# Patient Record
Sex: Female | Born: 1980
Health system: Southern US, Community
[De-identification: ages and names within clinical notes are randomized; demographics above are authoritative.]

## PROBLEM LIST (undated history)

## (undated) ENCOUNTER — Inpatient Hospital Stay (HOSPITAL_COMMUNITY): Payer: Self-pay

## (undated) DIAGNOSIS — F419 Anxiety disorder, unspecified: Secondary | ICD-10-CM

## (undated) DIAGNOSIS — O09529 Supervision of elderly multigravida, unspecified trimester: Secondary | ICD-10-CM

## (undated) DIAGNOSIS — L509 Urticaria, unspecified: Secondary | ICD-10-CM

## (undated) HISTORY — PX: TYMPANOSTOMY TUBE PLACEMENT: SHX32

## (undated) HISTORY — PX: DILATION AND CURETTAGE OF UTERUS: SHX78

## (undated) HISTORY — DX: Anxiety disorder, unspecified: F41.9

## (undated) HISTORY — DX: Supervision of elderly multigravida, unspecified trimester: O09.529

## (undated) HISTORY — DX: Urticaria, unspecified: L50.9

---
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
## 2001-10-31 ENCOUNTER — Other Ambulatory Visit: Admission: RE | Admit: 2001-10-31 | Discharge: 2001-10-31 | Payer: Self-pay

## 2001-11-03 ENCOUNTER — Encounter: Admission: RE | Admit: 2001-11-03 | Discharge: 2001-11-03 | Payer: Self-pay

## 2002-11-27 ENCOUNTER — Other Ambulatory Visit: Admission: RE | Admit: 2002-11-27 | Discharge: 2002-11-27 | Payer: Self-pay

## 2004-02-19 ENCOUNTER — Other Ambulatory Visit: Admission: RE | Admit: 2004-02-19 | Discharge: 2004-02-19 | Payer: Self-pay | Admitting: Obstetrics and Gynecology

## 2005-05-13 ENCOUNTER — Other Ambulatory Visit: Admission: RE | Admit: 2005-05-13 | Discharge: 2005-05-13 | Payer: Self-pay | Admitting: Obstetrics and Gynecology

## 2006-09-12 ENCOUNTER — Encounter: Admission: RE | Admit: 2006-09-12 | Discharge: 2006-12-11 | Payer: Self-pay | Admitting: Gynecology

## 2006-11-30 ENCOUNTER — Other Ambulatory Visit: Admission: RE | Admit: 2006-11-30 | Discharge: 2006-11-30 | Payer: Self-pay | Admitting: Gynecology

## 2007-06-15 HISTORY — PX: GUM SURGERY: SHX658

## 2009-04-18 ENCOUNTER — Ambulatory Visit: Payer: Self-pay | Admitting: Family Medicine

## 2009-04-18 DIAGNOSIS — H01119 Allergic dermatitis of unspecified eye, unspecified eyelid: Secondary | ICD-10-CM | POA: Insufficient documentation

## 2010-04-01 ENCOUNTER — Ambulatory Visit: Payer: Self-pay | Admitting: Family Medicine

## 2010-04-01 DIAGNOSIS — L259 Unspecified contact dermatitis, unspecified cause: Secondary | ICD-10-CM | POA: Insufficient documentation

## 2010-04-02 ENCOUNTER — Encounter: Payer: Self-pay | Admitting: Family Medicine

## 2010-04-05 ENCOUNTER — Telehealth (INDEPENDENT_AMBULATORY_CARE_PROVIDER_SITE_OTHER): Payer: Self-pay

## 2010-07-14 NOTE — Progress Notes (Signed)
  Phone Note Outgoing Call   Call placed by: Areta Haber CMA,  April 05, 2010 3:02 PM Call placed to: Patient Summary of Call: Courtesy call to pt - Pt states her arm is getting better, rash not completely gone but better. Advised pt lab results were negative and to make sure she completes all medications as instructed. Pt stated she would. Initial call taken by: Areta Haber CMA,  April 05, 2010 3:27 PM

## 2010-07-14 NOTE — Assessment & Plan Note (Signed)
Summary: RASH UPPER RIGHT ARM/TJ x 4 dys rm 5   Vital Signs:  Patient Profile:   30 Years Old Female CC:      Rash - R arm x 4 dys Height:     65 inches Weight:      134 pounds O2 Sat:      100 % O2 treatment:    Room Air Temp:     98.4 degrees F oral Pulse rate:   74 / minute Pulse rhythm:   irregular Resp:     16 per minute BP sitting:   108 / 75  (left arm) Cuff size:   regular  Vitals Entered By: Areta Haber CMA (April 01, 2010 5:58 PM)                  Current Allergies: No known allergies History of Present Illness Chief Complaint: Rash - R arm x 4 dys History of Present Illness:  Subjective:  Patient was camping 4 days ago and awoke with a pruritic rash over her right upper arm, consisting of several small lesions that looked like mosquito bites.  The rash has gradually become more confluent, and has had some clear drainage at various times.  The next day after rash appeared she had some nausea/vomiting that rapidly resolved.  She has felt well otherwise; no fevers, chills, and sweats.  She visited [facility] this morning and was started on Cephalexin and triamcinolone cream 0.1%.  Current Problems: DERMATITIS (ICD-692.9) CONTACT DERMATITIS, EYELID (ICD-373.32)   Current Meds YAZ 3-0.02 MG TABS (DROSPIRENONE-ETHINYL ESTRADIOL)  TRIAMCINOLONE ACETONIDE 0.1 % CREA (TRIAMCINOLONE ACETONIDE) as directed CEPHALEXIN 500 MG TABS (CEPHALEXIN) 1 tab by mouth three times a day for 10 dys VALTREX 1 GM TABS (VALACYCLOVIR HCL) One by mouth q8hr for shingles  REVIEW OF SYSTEMS Constitutional Symptoms      Denies fever, chills, night sweats, weight loss, weight gain, and fatigue.  Eyes       Denies change in vision, eye pain, eye discharge, glasses, contact lenses, and eye surgery. Ear/Nose/Throat/Mouth       Denies hearing loss/aids, change in hearing, ear pain, ear discharge, dizziness, frequent runny nose, frequent nose bleeds, sinus problems, sore throat,  hoarseness, and tooth pain or bleeding.  Respiratory       Denies dry cough, productive cough, wheezing, shortness of breath, asthma, bronchitis, and emphysema/COPD.  Cardiovascular       Denies murmurs, chest pain, and tires easily with exhertion.    Gastrointestinal       Denies stomach pain, nausea/vomiting, diarrhea, constipation, blood in bowel movements, and indigestion. Genitourniary       Denies painful urination, kidney stones, and loss of urinary control. Neurological       Denies paralysis, seizures, and fainting/blackouts. Musculoskeletal       Complains of redness and swelling.      Denies muscle pain, joint pain, joint stiffness, decreased range of motion, muscle weakness, and gout.  Skin       Denies bruising, unusual mles/lumps or sores, and hair/skin or nail changes.      Comments: itchy x 4 dys Psych       Denies mood changes, temper/anger issues, anxiety/stress, speech problems, depression, and sleep problems. Other Comments: Pt states she went camping this wknd, thought she had insect/spider bite on R upper arm. Pt states she was seen this am @ [facility], Dx [facility], prescribed antiobiotic and Steriod cream. Pt states she is here for 2nd opinion. Pt has  not seen her PCP for this.   Past History:  Past Medical History: Last updated: 04/18/2009 Unremarkable  Past Surgical History: Last updated: 04/18/2009 Denies surgical history  Family History: Last updated: 04/18/2009 Mother, Breast CA Father, pre-diabetic  Social History: Last updated: 04/18/2009 Non-smoker ETOH-yes NO Drugs [employer]   Objective:  Appearance:  Patient appears healthy, stated age, and in no acute distress  Skin:  Right upper arm over deltoid area:  There is an erythematous, slightly raised,  Zoster-like eruption beginning at approximately the [facility] joint extending distally in a dermatomal distribution.  The largest area, about 2cm by 4cm has some areas of minimal clear  drainage.  No swelling or fluctuance. Assessment New Problems: DERMATITIS (ICD-692.9)  SUSPECT HERPES ZOSTER  Plan New Medications/Changes: VALTREX 1 GM TABS (VALACYCLOVIR HCL) One by mouth q8hr for shingles  #21 x 0, 04/01/2010, Donna Christen MD  New Orders: T-Culture, Wound [[id number]] Est. Patient Level III [21308] Planning Comments:   May continue the cephalexin and triamcinolone cream.  Add Valtrex.  Wound culture sent. Follow-up with dermatologist if not improving one week.   The patient and/or caregiver has been counseled thoroughly with regard to medications prescribed including dosage, schedule, interactions, rationale for use, and possible side effects and they verbalize understanding.  Diagnoses and expected course of recovery discussed and will return if not improved as expected or if the condition worsens. Patient and/or caregiver verbalized understanding.  Prescriptions: VALTREX 1 GM TABS (VALACYCLOVIR HCL) One by mouth q8hr for shingles  #21 x 0   Entered and Authorized by:   Donna Christen MD   Signed by:   Donna Christen MD on 04/01/2010   Method used:   Electronically to        [facility] [phone]* (retail)       [address] [place], [place]  [address]       Ph: [phone]       Fax: [phone]   RxID:   [id number]   Orders Added: 1)  T-Culture, Wound [[id number]] 2)  Est. Patient Level III [64332]

## 2011-06-06 ENCOUNTER — Emergency Department
Admit: 2011-06-06 | Discharge: 2011-06-06 | Disposition: A | Discharge status: Home / Self Care | Payer: Managed Care, Other (non HMO)

## 2011-06-06 ENCOUNTER — Emergency Department
Admission: EM | Admit: 2011-06-06 | Discharge: 2011-06-06 | Disposition: A | Discharge status: Home / Self Care | Payer: Managed Care, Other (non HMO) | Source: Home / Self Care | Attending: Emergency Medicine | Admitting: Emergency Medicine

## 2011-06-06 ENCOUNTER — Encounter: Payer: Self-pay | Admitting: Emergency Medicine

## 2011-06-06 DIAGNOSIS — S2341XA Sprain of ribs, initial encounter: Secondary | ICD-10-CM

## 2011-06-06 DIAGNOSIS — IMO0002 Reserved for concepts with insufficient information to code with codable children: Secondary | ICD-10-CM

## 2011-06-06 MED ORDER — CYCLOBENZAPRINE HCL 10 MG PO TABS: ORAL_TABLET | ORAL | Status: AC

## 2011-06-06 MED ORDER — ETODOLAC 500 MG PO TABS: 500.0000 mg | ORAL_TABLET | Freq: Two times a day (BID) | ORAL | Status: AC

## 2011-06-06 NOTE — ED Provider Notes (Signed)
History     CSN: [id number]  Arrival date & time 06/06/11  1519   First MD Initiated Contact with Patient 06/06/11 1550      Chief Complaint  Patient presents with  . Chest Pain    (Consider location/radiation/quality/duration/timing/severity/associated sxs/prior treatment) HPI Comments: 2 weeks ago, had URI symptoms with severe cough slight sputum, fever chills. She treated this with OTC symptomatic care in the URI symptoms, including the cough fever and chills have resolved.  However, chief complaint is pleuritic right lateral chest pain inferior and lateral to right breast. Denies breast symptoms. Never had these symptoms before.  She smokes cigarettes occasionally, and I counseled her on quitting smoking.  Patient is a 30 y.o. female presenting with chest pain. The history is provided by the patient.  Chest Pain The chest pain began 5 - 7 days ago. Chest pain occurs frequently. The chest pain is worsening. The pain is associated with coughing and breathing (Changing position). At its most intense, the pain is at 6/10. The pain is currently at 5/10. The severity of the pain is moderate. The quality of the pain is described as sharp. The pain does not radiate. Chest pain is worsened by certain positions and deep breathing. Primary symptoms include fatigue and cough (Rare, minimal cough. (Had much worse severe cough for over a week ago which is almost essentially resolved)). Pertinent negatives for primary symptoms include no fever, no syncope, no wheezing, no palpitations, no nausea, no vomiting and no dizziness.     History reviewed. No pertinent past medical history.  History reviewed. No pertinent past surgical history.  No family history on file.  History  Substance Use Topics  . Smoking status: Current Some Day Smoker  . Smokeless tobacco: Not on file  . Alcohol Use: Yes    OB History    Grav Para Term Preterm Abortions TAB SAB Ect Mult Living                   Review of Systems  Constitutional: Positive for fatigue. Negative for fever.  HENT: Negative.   Eyes: Negative.   Respiratory: Positive for cough (Rare, minimal cough. (Had much worse severe cough for over a week ago which is almost essentially resolved)). Negative for wheezing.   Cardiovascular: Positive for chest pain. Negative for palpitations, leg swelling and syncope.  Gastrointestinal: Negative for nausea, vomiting and abdominal distention.  Genitourinary: Negative.   Neurological: Negative.  Negative for dizziness.  Hematological: Negative.   Psychiatric/Behavioral: Negative.    She denies chance of pregnancy.  Allergies  Review of patient's allergies indicates no known allergies.  Home Medications   Current Outpatient Rx  Name Route Sig Dispense Refill  . CYCLOBENZAPRINE HCL 10 MG PO TABS  One half or one tablet every 8 hours as needed for muscle relaxant 21 tablet 0  . ETODOLAC 500 MG PO TABS Oral Take 1 tablet (500 mg total) by mouth 2 (two) times daily with a meal. As needed for pain. 20 tablet 0    BP 126/87  Pulse 90  Temp(Src) 99.6 F (37.6 C) (Oral)  Resp 16  Ht 5\' 5"  (1.651 m)  Wt 120 lb (54.432 kg)  BMI 19.97 kg/m2  SpO2 99%  LMP 05/22/2011  Physical Exam  Nursing note and vitals reviewed. Constitutional: She is oriented to person, place, and time. She appears well-developed and well-nourished.  Non-toxic appearance. No distress.       Uncomfortable from right lateral chest discomfort. But no  acute cardiorespiratory distress.  HENT:  Head: Normocephalic and atraumatic.  Right Ear: External ear normal.  Left Ear: External ear normal.  Mouth/Throat: Oropharynx is clear and moist.  Eyes: Conjunctivae and EOM are normal. Pupils are equal, round, and reactive to light. No scleral icterus.  Neck: Normal range of motion. Neck supple. No JVD present.  Cardiovascular: Normal rate, regular rhythm and normal heart sounds.  Exam reveals no gallop and no  friction rub.   No murmur heard. Pulmonary/Chest: Effort normal and breath sounds normal. No respiratory distress. She has no wheezes. She has no rales. She exhibits tenderness (Positive exquisite tenderness right anterior lateral chest wall.).       Breast exam deferred  Abdominal: Soft. She exhibits no distension. There is no tenderness.  Musculoskeletal: Normal range of motion. She exhibits no edema.  Lymphadenopathy:    She has no cervical adenopathy.  Neurological: She is alert and oriented to person, place, and time.  Skin: Skin is warm and dry. No rash noted.  Psychiatric: She has a normal mood and affect. Her behavior is normal.    ED Course  Procedures (including critical care time)  Labs Reviewed - No data to display Dg Ribs Unilateral W/chest Right  06/06/2011  *RADIOLOGY REPORT*  Clinical Data: Cough, right lower rib pain  RIGHT RIBS AND CHEST - 3+ VIEW  Comparison: None.  Findings: Normal heart size and vascularity.  Lungs remain clear. Negative for pneumonia, collapse, consolidation, effusion, or pneumothorax.  Trachea midline.  Right ribs appear intact.  No displaced fracture or focal rib abnormality  IMPRESSION: No acute finding or chest process  Original Report Authenticated By: Judie Petit. Ruel Favors, M.D.     1. Sprain and strain of ribs       MDM  We reviewed normal x-ray right ribs and chest x-ray. Likely, sprain and strain of ribs from excessive coughing 1 week ago. Her URI symptoms have  essentially resolved. Treat with Flexeril and Lodine. Rib belt applied and that definitely decrease some of her pain. Advised heat and other symptomatic care. Followup with PCP if no better one week, sooner if worse or new symptoms. I urged her to quit smoking and explained and risks of not doing so. She voiced understanding and agreement with treatment plan.        Lonell Face, MD 06/06/11 [phone]

## 2011-06-06 NOTE — ED Notes (Signed)
Had cough couple weeks ago and now has rib pain especially upon movement.

## 2011-06-10 ENCOUNTER — Telehealth: Payer: Self-pay | Admitting: Family Medicine

## 2013-01-29 LAB — OB RESULTS CONSOLE ABO/RH: RH Type: POSITIVE

## 2013-01-29 LAB — OB RESULTS CONSOLE HEPATITIS B SURFACE ANTIGEN: Hepatitis B Surface Ag: NEGATIVE

## 2013-01-29 LAB — OB RESULTS CONSOLE GC/CHLAMYDIA
CHLAMYDIA, DNA PROBE: NEGATIVE
GC PROBE AMP, GENITAL: NEGATIVE

## 2013-01-29 LAB — OB RESULTS CONSOLE HIV ANTIBODY (ROUTINE TESTING): HIV: NONREACTIVE

## 2013-01-29 LAB — OB RESULTS CONSOLE RPR: RPR: NONREACTIVE

## 2013-01-29 LAB — OB RESULTS CONSOLE ANTIBODY SCREEN: ANTIBODY SCREEN: NEGATIVE

## 2013-01-29 LAB — OB RESULTS CONSOLE RUBELLA ANTIBODY, IGM: RUBELLA: IMMUNE

## 2013-06-14 NOTE — L&D Delivery Note (Signed)
Delivery Note At 8:09 AM a viable female was delivered via Vaginal, Spontaneous Delivery (Presentation: ; Occiput Anterior).  APGAR:9/9 , ; weight .   Placenta status: Intact, Spontaneous.  Cord: 3 vessels with the following complications: None.  Cord pH: not sent Loose nuchal X 1 Anesthesia: Epidural  Episiotomy: None Lacerations: sec deg Suture Repair: 3.0 chromic Est. Blood Loss (mL): 300  Mom to postpartum.  Baby to Nursery.  Meriel PicaHOLLAND,Eamonn Sermeno M 08/22/2013, 8:23 AM

## 2013-08-21 ENCOUNTER — Inpatient Hospital Stay (HOSPITAL_COMMUNITY)
Admission: AD | Admit: 2013-08-21 | Discharge: 2013-08-24 | DRG: 775 | Disposition: A | Discharge status: Home / Self Care | Payer: Managed Care, Other (non HMO) | Source: Ambulatory Visit | Attending: Obstetrics and Gynecology | Admitting: Obstetrics and Gynecology

## 2013-08-21 ENCOUNTER — Encounter (HOSPITAL_COMMUNITY): Payer: Self-pay | Admitting: *Deleted

## 2013-08-21 DIAGNOSIS — O99334 Smoking (tobacco) complicating childbirth: Secondary | ICD-10-CM | POA: Diagnosis present

## 2013-08-21 DIAGNOSIS — K219 Gastro-esophageal reflux disease without esophagitis: Secondary | ICD-10-CM | POA: Diagnosis present

## 2013-08-21 DIAGNOSIS — Z3490 Encounter for supervision of normal pregnancy, unspecified, unspecified trimester: Secondary | ICD-10-CM

## 2013-08-21 LAB — OB RESULTS CONSOLE GBS: STREP GROUP B AG: NEGATIVE

## 2013-08-21 LAB — CBC
HCT: 35.9 % — ABNORMAL LOW (ref 36.0–46.0)
Hemoglobin: 12 g/dL (ref 12.0–15.0)
MCH: 29.7 pg (ref 26.0–34.0)
MCHC: 33.4 g/dL (ref 30.0–36.0)
MCV: 88.9 fL (ref 78.0–100.0)
Platelets: 286 K/uL (ref 150–400)
RBC: 4.04 MIL/uL (ref 3.87–5.11)
RDW: 13.6 % (ref 11.5–15.5)
WBC: 9.8 K/uL (ref 4.0–10.5)

## 2013-08-21 MED ORDER — LACTATED RINGERS IV SOLN
INTRAVENOUS | Status: DC
  Administered 2013-08-22: 04:00:00 via INTRAVENOUS

## 2013-08-21 MED ORDER — CITRIC ACID-SODIUM CITRATE 334-500 MG/5ML PO SOLN: 30.0000 mL | ORAL | Status: DC | PRN

## 2013-08-21 MED ORDER — OXYTOCIN 40 UNITS IN LACTATED RINGERS INFUSION - SIMPLE MED
62.5000 mL/h | INTRAVENOUS | Status: DC
  Administered 2013-08-22: 999 mL/h via INTRAVENOUS
  Filled 2013-08-21: qty 1000

## 2013-08-21 MED ORDER — IBUPROFEN 600 MG PO TABS: 600.0000 mg | ORAL_TABLET | Freq: Four times a day (QID) | ORAL | Status: DC | PRN

## 2013-08-21 MED ORDER — TERBUTALINE SULFATE 1 MG/ML IJ SOLN: 0.2500 mg | Freq: Once | INTRAMUSCULAR | Status: AC | PRN

## 2013-08-21 MED ORDER — MISOPROSTOL 25 MCG QUARTER TABLET
50.0000 ug | ORAL_TABLET | Freq: Once | ORAL | Status: AC
  Administered 2013-08-22: 50 ug via ORAL
  Filled 2013-08-21: qty 0.5

## 2013-08-21 MED ORDER — ONDANSETRON HCL 4 MG/2ML IJ SOLN
4.0000 mg | Freq: Four times a day (QID) | INTRAMUSCULAR | Status: DC | PRN
Start: 2013-08-21 — End: 2013-08-22

## 2013-08-21 MED ORDER — OXYTOCIN BOLUS FROM INFUSION: 500.0000 mL | INTRAVENOUS | Status: DC

## 2013-08-21 MED ORDER — LIDOCAINE HCL (PF) 1 % IJ SOLN
30.0000 mL | INTRAMUSCULAR | Status: DC | PRN
  Filled 2013-08-21: qty 30

## 2013-08-21 MED ORDER — OXYCODONE-ACETAMINOPHEN 5-325 MG PO TABS
1.0000 | ORAL_TABLET | ORAL | Status: DC | PRN
Start: 2013-08-21 — End: 2013-08-22

## 2013-08-21 MED ORDER — ACETAMINOPHEN 325 MG PO TABS
650.0000 mg | ORAL_TABLET | ORAL | Status: DC | PRN
Start: 2013-08-21 — End: 2013-08-22

## 2013-08-21 MED ORDER — OXYTOCIN 40 UNITS IN LACTATED RINGERS INFUSION - SIMPLE MED: 1.0000 m[IU]/min | INTRAVENOUS | Status: DC

## 2013-08-21 MED ORDER — LACTATED RINGERS IV SOLN: 500.0000 mL | INTRAVENOUS | Status: DC | PRN

## 2013-08-21 NOTE — H&P (Signed)
Taylor HawkingHolly E Bell is a 33 y.o. female presenting at 3237.5 after rom in office after exam.  Uncomplicated PNC.  Negative GBS Maternal Medical History:  Reason for admission: Rupture of membranes.   Contractions: Frequency: irregular.   Perceived severity is moderate.    Fetal activity: Perceived fetal activity is normal.    Prenatal complications: no prenatal complications Prenatal Complications - Diabetes: none.    OB History   Grav Para Term Preterm Abortions TAB SAB Ect Mult Living   2 0 0 0 1 0 1 0 0 0      History reviewed. No pertinent past medical history. Past Surgical History  Procedure Laterality Date  . Wisdom tooth extraction  2000  . Gum surgery  2009   Family History: family history is not on file. Social History:  reports that she has been smoking.  She does not have any smokeless tobacco history on file. She reports that she does not drink alcohol or use illicit drugs.   Prenatal Transfer Tool  Maternal Diabetes: No Genetic Screening: Normal Maternal Ultrasounds/Referrals: Normal Fetal Ultrasounds or other Referrals:  None Maternal Substance Abuse:  No Significant Maternal Medications:  None Significant Maternal Lab Results:  None Other Comments:  None  ROS  Dilation: 2 Effacement (%): 50 Station: -3 Exam by:: Valentina Lucks. Woods, RN Blood pressure 123/83, pulse 91, temperature 98.2 F (36.8 C), temperature source Oral, resp. rate 18, height 5\' 5"  (1.651 m), weight 74.39 kg (164 lb), unknown if currently breastfeeding. Maternal Exam:  Uterine Assessment: Contraction strength is mild.  Contraction frequency is irregular.   Abdomen: Patient reports no abdominal tenderness. Fundal height is c/w dates.   Estimated fetal weight is 7.   Fetal presentation: vertex  Introitus: Amniotic fluid character: clear.  Pelvis: adequate for delivery.   Cervix: 2 cm and 50 % effaced  vertex  Fetal Exam Fetal State Assessment: Category I - tracings are  normal.     Physical Exam  Prenatal labs: ABO, Rh: A/Positive/-- (08/18 0000) Antibody: Negative (08/18 0000) Rubella: Immune (08/18 0000) RPR: Nonreactive (08/18 0000)  HBsAg: Negative (08/18 0000)  HIV: Non-reactive (08/18 0000)  GBS: Negative (03/10 0000)   Assessment/Plan: IUP at 37.5 with ROM Begin pitocin risk discussed   Taylor Bell S 08/21/2013, 7:31 PM

## 2013-08-21 NOTE — MAU Note (Signed)
Per Dr. Arelia SneddonMcComb: Patient is to be a direct admit, confirmed ROM in the office by Dr. Vincente PoliGrewal.

## 2013-08-22 ENCOUNTER — Encounter (HOSPITAL_COMMUNITY): Payer: Managed Care, Other (non HMO) | Admitting: Anesthesiology

## 2013-08-22 ENCOUNTER — Inpatient Hospital Stay (HOSPITAL_COMMUNITY): Payer: Managed Care, Other (non HMO) | Admitting: Anesthesiology

## 2013-08-22 ENCOUNTER — Encounter (HOSPITAL_COMMUNITY): Payer: Self-pay | Admitting: Anesthesiology

## 2013-08-22 LAB — RPR: RPR: NONREACTIVE

## 2013-08-22 MED ORDER — BENZOCAINE-MENTHOL 20-0.5 % EX AERO
1.0000 "application " | INHALATION_SPRAY | CUTANEOUS | Status: DC | PRN
  Administered 2013-08-22: 1 via TOPICAL
  Filled 2013-08-22 (×2): qty 56

## 2013-08-22 MED ORDER — EPHEDRINE 5 MG/ML INJ
INTRAVENOUS | Status: AC
  Filled 2013-08-22: qty 4

## 2013-08-22 MED ORDER — FAMOTIDINE 20 MG PO TABS
20.0000 mg | ORAL_TABLET | Freq: Two times a day (BID) | ORAL | Status: DC
  Filled 2013-08-22 (×3): qty 1

## 2013-08-22 MED ORDER — PHENYLEPHRINE 40 MCG/ML (10ML) SYRINGE FOR IV PUSH (FOR BLOOD PRESSURE SUPPORT)
PREFILLED_SYRINGE | INTRAVENOUS | Status: AC
  Filled 2013-08-22: qty 10

## 2013-08-22 MED ORDER — DIBUCAINE 1 % RE OINT
1.0000 "application " | TOPICAL_OINTMENT | RECTAL | Status: DC | PRN
  Filled 2013-08-22: qty 28

## 2013-08-22 MED ORDER — PROMETHAZINE HCL 25 MG/ML IJ SOLN: 12.5000 mg | Freq: Once | INTRAMUSCULAR | Status: DC

## 2013-08-22 MED ORDER — FENTANYL 2.5 MCG/ML BUPIVACAINE 1/10 % EPIDURAL INFUSION (WH - ANES): 14.0000 mL/h | INTRAMUSCULAR | Status: DC | PRN

## 2013-08-22 MED ORDER — FLEET ENEMA 7-19 GM/118ML RE ENEM: 1.0000 | ENEMA | Freq: Every day | RECTAL | Status: DC | PRN

## 2013-08-22 MED ORDER — WITCH HAZEL-GLYCERIN EX PADS: 1.0000 "application " | MEDICATED_PAD | CUTANEOUS | Status: DC | PRN

## 2013-08-22 MED ORDER — LORATADINE 10 MG PO TABS
10.0000 mg | ORAL_TABLET | Freq: Every day | ORAL | Status: DC | PRN
  Filled 2013-08-22: qty 1

## 2013-08-22 MED ORDER — LACTATED RINGERS IV SOLN
500.0000 mL | Freq: Once | INTRAVENOUS | Status: AC
  Administered 2013-08-22: 500 mL via INTRAVENOUS

## 2013-08-22 MED ORDER — LIDOCAINE HCL (PF) 1 % IJ SOLN
INTRAMUSCULAR | Status: DC | PRN
  Administered 2013-08-22 (×2): 4 mL

## 2013-08-22 MED ORDER — IBUPROFEN 800 MG PO TABS
800.0000 mg | ORAL_TABLET | Freq: Three times a day (TID) | ORAL | Status: DC | PRN
  Administered 2013-08-22 – 2013-08-24 (×7): 800 mg via ORAL
  Filled 2013-08-22 (×7): qty 1

## 2013-08-22 MED ORDER — LANOLIN HYDROUS EX OINT: TOPICAL_OINTMENT | CUTANEOUS | Status: DC | PRN

## 2013-08-22 MED ORDER — PRENATAL MULTIVITAMIN CH
1.0000 | ORAL_TABLET | Freq: Every day | ORAL | Status: DC
  Administered 2013-08-22 – 2013-08-24 (×3): 1 via ORAL
  Filled 2013-08-22 (×3): qty 1

## 2013-08-22 MED ORDER — ONDANSETRON HCL 4 MG/2ML IJ SOLN: 4.0000 mg | INTRAMUSCULAR | Status: DC | PRN

## 2013-08-22 MED ORDER — BUTORPHANOL TARTRATE 1 MG/ML IJ SOLN
INTRAMUSCULAR | Status: AC
  Filled 2013-08-22: qty 1

## 2013-08-22 MED ORDER — OXYCODONE-ACETAMINOPHEN 5-325 MG PO TABS
1.0000 | ORAL_TABLET | Freq: Four times a day (QID) | ORAL | Status: DC | PRN
  Administered 2013-08-22: 2 via ORAL
  Administered 2013-08-22 – 2013-08-24 (×4): 1 via ORAL
  Filled 2013-08-22 (×4): qty 1
  Filled 2013-08-22: qty 2

## 2013-08-22 MED ORDER — TETANUS-DIPHTH-ACELL PERTUSSIS 5-2.5-18.5 LF-MCG/0.5 IM SUSP: 0.5000 mL | Freq: Once | INTRAMUSCULAR | Status: DC

## 2013-08-22 MED ORDER — EPHEDRINE 5 MG/ML INJ: 10.0000 mg | INTRAVENOUS | Status: DC | PRN

## 2013-08-22 MED ORDER — SENNOSIDES-DOCUSATE SODIUM 8.6-50 MG PO TABS
2.0000 | ORAL_TABLET | ORAL | Status: DC
  Administered 2013-08-23 (×2): 2 via ORAL
  Filled 2013-08-22 (×2): qty 2

## 2013-08-22 MED ORDER — FENTANYL 2.5 MCG/ML BUPIVACAINE 1/10 % EPIDURAL INFUSION (WH - ANES)
INTRAMUSCULAR | Status: AC
  Filled 2013-08-22: qty 125

## 2013-08-22 MED ORDER — BUTORPHANOL TARTRATE 1 MG/ML IJ SOLN
1.0000 mg | Freq: Once | INTRAMUSCULAR | Status: AC
  Administered 2013-08-22: 1 mg via INTRAVENOUS

## 2013-08-22 MED ORDER — DIPHENHYDRAMINE HCL 50 MG/ML IJ SOLN: 12.5000 mg | INTRAMUSCULAR | Status: DC | PRN

## 2013-08-22 MED ORDER — MEASLES, MUMPS & RUBELLA VAC ~~LOC~~ INJ
0.5000 mL | INJECTION | Freq: Once | SUBCUTANEOUS | Status: DC
  Filled 2013-08-22: qty 0.5

## 2013-08-22 MED ORDER — ZOLPIDEM TARTRATE 5 MG PO TABS: 5.0000 mg | ORAL_TABLET | Freq: Every evening | ORAL | Status: DC | PRN

## 2013-08-22 MED ORDER — PHENYLEPHRINE 40 MCG/ML (10ML) SYRINGE FOR IV PUSH (FOR BLOOD PRESSURE SUPPORT): 80.0000 ug | PREFILLED_SYRINGE | INTRAVENOUS | Status: DC | PRN

## 2013-08-22 MED ORDER — ONDANSETRON HCL 4 MG PO TABS: 4.0000 mg | ORAL_TABLET | ORAL | Status: DC | PRN

## 2013-08-22 MED ORDER — SIMETHICONE 80 MG PO CHEW
80.0000 mg | CHEWABLE_TABLET | ORAL | Status: DC | PRN
  Filled 2013-08-22: qty 1

## 2013-08-22 MED ORDER — FENTANYL 2.5 MCG/ML BUPIVACAINE 1/10 % EPIDURAL INFUSION (WH - ANES)
INTRAMUSCULAR | Status: DC | PRN
  Administered 2013-08-22: 14 mL/h via EPIDURAL

## 2013-08-22 MED ORDER — BISACODYL 10 MG RE SUPP
10.0000 mg | Freq: Every day | RECTAL | Status: DC | PRN
  Filled 2013-08-22: qty 1

## 2013-08-22 MED ORDER — DIPHENHYDRAMINE HCL 25 MG PO CAPS: 25.0000 mg | ORAL_CAPSULE | Freq: Four times a day (QID) | ORAL | Status: DC | PRN

## 2013-08-22 MED ORDER — DEXTROSE IN LACTATED RINGERS 5 % IV SOLN: INTRAVENOUS | Status: DC

## 2013-08-22 MED ORDER — MONTELUKAST SODIUM 10 MG PO TABS
10.0000 mg | ORAL_TABLET | Freq: Every day | ORAL | Status: DC
  Filled 2013-08-22 (×2): qty 1

## 2013-08-22 NOTE — Anesthesia Preprocedure Evaluation (Signed)
Anesthesia Evaluation  Patient identified by MRN, date of birth, ID band Patient awake    Reviewed: Allergy & Precautions, H&P , Patient's Chart, lab work & pertinent test results  Airway Mallampati: III TM Distance: >3 FB Neck ROM: full    Dental no notable dental hx. (+) Teeth Intact   Pulmonary neg pulmonary ROS, Current Smoker,  breath sounds clear to auscultation  Pulmonary exam normal       Cardiovascular negative cardio ROS  Rhythm:regular Rate:Normal     Neuro/Psych negative neurological ROS  negative psych ROS   GI/Hepatic Neg liver ROS, GERD-  Medicated and Controlled,  Endo/Other  negative endocrine ROS  Renal/GU negative Renal ROS  negative genitourinary   Musculoskeletal   Abdominal Normal abdominal exam  (+)   Peds  Hematology negative hematology ROS (+)   Anesthesia Other Findings   Reproductive/Obstetrics (+) Pregnancy                           Anesthesia Physical Anesthesia Plan  ASA: II  Anesthesia Plan: Epidural   Post-op Pain Management:    Induction:   Airway Management Planned:   Additional Equipment:   Intra-op Plan:   Post-operative Plan:   Informed Consent: I have reviewed the patients History and Physical, chart, labs and discussed the procedure including the risks, benefits and alternatives for the proposed anesthesia with the patient or authorized representative who has indicated his/her understanding and acceptance.     Plan Discussed with: Anesthesiologist  Anesthesia Plan Comments:         Anesthesia Quick Evaluation

## 2013-08-22 NOTE — Anesthesia Procedure Notes (Signed)
Epidural Patient location during procedure: OB Start time: 08/22/2013 2:50 AM  Staffing Anesthesiologist: Hien Perreira A. Performed by: anesthesiologist   Preanesthetic Checklist Completed: patient identified, site marked, surgical consent, pre-op evaluation, timeout performed, IV checked, risks and benefits discussed and monitors and equipment checked  Epidural Patient position: sitting Prep: site prepped and draped and DuraPrep Patient monitoring: continuous pulse ox and blood pressure Approach: midline Injection technique: LOR air  Needle:  Needle type: Tuohy  Needle gauge: 17 G Needle length: 9 cm and 9 Needle insertion depth: 4 cm Catheter type: closed end flexible Catheter size: 19 Gauge Catheter at skin depth: 9 cm Test dose: negative and Other  Assessment Events: blood not aspirated, injection not painful, no injection resistance, negative IV test and no paresthesia  Additional Notes Patient identified. Risks and benefits discussed including failed block, incomplete  Pain control, post dural puncture headache, nerve damage, paralysis, blood pressure Changes, nausea, vomiting, reactions to medications-both toxic and allergic and post Partum back pain. All questions were answered. Patient expressed understanding and wished to proceed. Sterile technique was used throughout procedure. Epidural site was Dressed with sterile barrier dressing. No paresthesias, signs of intravascular injection Or signs of intrathecal spread were encountered.  Patient was more comfortable after the epidural was dosed. Please see RN's note for documentation of vital signs and FHR which are stable.

## 2013-08-22 NOTE — Lactation Note (Signed)
This note was copied from the chart of Taylor Bell. Lactation Consultation Note Initial consult:  [name] 7 hours old and sleeping, breastfed at 1400, Randa LynnLinda Nash RN viewed latch Reviewed basics, hand expression, lactation support services and brochure. Encouraged mother to call for further assistance.  Patient Name: Taylor Bell ZOXWR'UToday's Date: 08/22/2013 Reason for consult: Initial assessment   Maternal Data Infant to breast within first hour of birth: No Has patient been taught Hand Expression?: Yes Does the patient have breastfeeding experience prior to this delivery?: No  Feeding Feeding Type: Breast Fed Length of feed: 20 min  LATCH Score/Interventions                      Lactation Tools Discussed/Used     Consult Status Consult Status: Follow-up Date: 08/23/13 Follow-up type: In-patient    Dahlia ByesBerkelhammer, Ruth [facility]os[facility] 08/22/2013, 3:28 PM

## 2013-08-22 NOTE — Anesthesia Postprocedure Evaluation (Signed)
  Anesthesia Post-op Note  Patient: Taylor Bell  Procedure(s) Performed: * No procedures listed *  Patient Location: PACU and Mother/Baby  Anesthesia Type:Epidural  Level of Consciousness: awake, alert , oriented and patient cooperative  Airway and Oxygen Therapy: Patient Spontanous Breathing  Post-op Pain: none  Post-op Assessment: Post-op Vital signs reviewed, Patient's Cardiovascular Status Stable, Respiratory Function Stable, No headache, No backache, No residual numbness and No residual motor weakness  Post-op Vital Signs: Reviewed and stable  Complications: No apparent anesthesia complications  Anesthesia Post Note  Patient: Taylor Bell  Procedure(s) Performed: * No procedures listed *  Anesthesia type: Epidural  Patient location: Mother/Baby  Post pain: Pain level controlled  Post assessment: Post-op Vital signs reviewed  Last Vitals:  Filed Vitals:   08/22/13 1020  BP: 128/78  Pulse: 102  Temp: 36.8 C  Resp: 20    Post vital signs: Reviewed  Level of consciousness:alert  Complications: No apparent anesthesia complications

## 2013-08-23 LAB — CBC
HEMATOCRIT: 28.6 % — AB (ref 36.0–46.0)
Hemoglobin: 9.6 g/dL — ABNORMAL LOW (ref 12.0–15.0)
MCH: 29.6 pg (ref 26.0–34.0)
MCHC: 33.6 g/dL (ref 30.0–36.0)
MCV: 88.3 fL (ref 78.0–100.0)
Platelets: 219 10*3/uL (ref 150–400)
RBC: 3.24 MIL/uL — ABNORMAL LOW (ref 3.87–5.11)
RDW: 13.8 % (ref 11.5–15.5)
WBC: 13.6 10*3/uL — ABNORMAL HIGH (ref 4.0–10.5)

## 2013-08-23 NOTE — Progress Notes (Signed)
Post Partum Day 1 Subjective: up ad lib, voiding, tolerating PO and complains of cramping and some perineal discomfort  Objective: Blood pressure 120/68, pulse 89, temperature 98.3 F (36.8 C), temperature source Oral, resp. rate 16, height 5\' 5"  (1.651 m), weight 164 lb (74.39 kg), SpO2 100.00%, unknown if currently breastfeeding.  Physical Exam:  General: alert and cooperative Lochia: appropriate Uterine Fundus: firm Incision: perineum intact DVT Evaluation: No evidence of DVT seen on physical exam. Negative Homan's sign. No cords or calf tenderness. No significant calf/ankle edema.   Recent Labs  08/21/13 1725 08/23/13 0555  HGB 12.0 9.6*  HCT 35.9* 28.6*    Assessment/Plan: Plan for discharge tomorrow Sitz bath   LOS: 2 days   Jolyne Laye G 08/23/2013, 8:39 AM

## 2013-08-24 ENCOUNTER — Ambulatory Visit: Payer: Self-pay

## 2013-08-24 MED ORDER — OXYCODONE-ACETAMINOPHEN 7.5-325 MG PO TABS: 1.0000 | ORAL_TABLET | ORAL | Status: DC | PRN

## 2013-08-24 MED ORDER — IBUPROFEN 600 MG PO TABS: 600.0000 mg | ORAL_TABLET | Freq: Four times a day (QID) | ORAL | Status: DC | PRN

## 2013-08-24 NOTE — Progress Notes (Signed)
Post Partum Day 2 Subjective: no complaints, up ad lib, voiding and tolerating PO  Objective: Blood pressure 113/79, pulse 89, temperature 98.8 F (37.1 C), temperature source Oral, resp. rate 18, height 5\' 5"  (1.651 m), weight 164 lb (74.39 kg), SpO2 100.00%, unknown if currently breastfeeding.  Physical Exam:  General: alert and cooperative Lochia: appropriate Uterine Fundus: firm Incision: perineum intact DVT Evaluation: No evidence of DVT seen on physical exam. Negative Homan's sign. No cords or calf tenderness. No significant calf/ankle edema.   Recent Labs  08/21/13 1725 08/23/13 0555  HGB 12.0 9.6*  HCT 35.9* 28.6*    Assessment/Plan: Discharge home Unable to sign discharge orders. MD admission orders have not been signed   LOS: 3 days   CURTIS,CAROL G 08/24/2013, 8:56 AM

## 2013-08-24 NOTE — Discharge Summary (Signed)
Obstetric Discharge Summary Reason for Admission: rupture of membranes Prenatal Procedures: none Intrapartum Procedures: spontaneous vaginal delivery Postpartum Procedures: none Complications-Operative and Postpartum: second degree perineal laceration Hemoglobin  Date Value Ref Range Status  08/23/2013 9.6* 12.0 - 15.0 g/dL Final     DELTA CHECK NOTED     REPEATED TO VERIFY     HCT  Date Value Ref Range Status  08/23/2013 28.6* 36.0 - 46.0 % Final    Physical Exam:  General: alert, cooperative and appears stated age Lochia: appropriate Uterine Fundus: firm Incision: healing well, no significant drainage, no dehiscence DVT Evaluation: No evidence of DVT seen on physical exam. Negative Homan's sign. No cords or calf tenderness.  Discharge Diagnoses: Term Pregnancy-delivered  Discharge Information: Date: 08/24/2013 Activity: pelvic rest Diet: routine Medications: PNV, Ibuprofen and Percocet Condition: stable Instructions: refer to practice specific booklet Discharge to: home   Newborn Data: Live born female  Birth Weight: 6 lb 10.4 oz (3015 g) APGAR: 7,   Home with mother.  Taylor Bell 08/24/2013, 5:45 PM

## 2013-08-24 NOTE — Lactation Note (Signed)
This note was copied from the chart of Girl Amanda PeaHolly Stroupe. Lactation Consultation Note  Patient Name: Girl Amanda PeaHolly Stroupe ZOXWR'UToday's Date: 08/24/2013 Reason for consult: Follow-up assessment Per mom my milk is in and  I have some firm spots. LC assessed mom breast firm with firm nodules with a areas of just full breast tissue. Baby hungry and rooting , reviewed basics and had mom massage breast , hand express, prepump off fullness and latch with firm support. Baby latched right on and was in a consistent pattern 12 mins and still feeding. Per mom feeling relief. Breast softening. Per  Mom nipples tender , noted small positional strips.  Instructed mom on the use comfort gels, breast shells , and hand pump. Also per mom has a DEBP at home , increased flange to #27. Mom aware of the BFS[facility] and the LC O/Pservices.    Maternal Data    Feeding Feeding Type: Breast Fed Length of feed: 25 min  LATCH Score/Interventions Latch: Grasps breast easily, tongue down, lips flanged, rhythmical sucking.  Audible Swallowing: Spontaneous and intermittent Intervention(s): Skin to skin;Hand expression;Alternate breast massage  Type of Nipple: Everted at rest and after stimulation  Comfort (Breast/Nipple): Filling, red/small blisters or bruises, mild/mod discomfort  Problem noted: Filling  Hold (Positioning): Assistance needed to correctly position infant at breast and maintain latch. Intervention(s): Breastfeeding basics reviewed;Support Pillows;Position options;Skin to skin  LATCH Score: 8  Lactation Tools Discussed/Used Tools: Shells;Pump Shell Type: Inverted Breast pump type: Manual Pump Review: Setup, frequency, and cleaning;Milk Storage Initiated by:: MAI  Date initiated:: 08/24/13   Consult Status Consult Status: Complete Date: 08/24/13 Follow-up type: In-patient    Kathrin Greathouseorio, Cayce Paschal Ann 08/24/2013, 1:14 PM

## 2014-04-15 ENCOUNTER — Encounter (HOSPITAL_COMMUNITY): Payer: Self-pay | Admitting: Anesthesiology

## 2014-09-30 ENCOUNTER — Other Ambulatory Visit: Payer: Self-pay | Admitting: Obstetrics & Gynecology

## 2014-09-30 DIAGNOSIS — Z1231 Encounter for screening mammogram for malignant neoplasm of breast: Secondary | ICD-10-CM

## 2014-09-30 DIAGNOSIS — Z803 Family history of malignant neoplasm of breast: Secondary | ICD-10-CM

## 2015-03-20 ENCOUNTER — Ambulatory Visit
Admission: RE | Admit: 2015-03-20 | Discharge: 2015-03-20 | Disposition: A | Discharge status: Home / Self Care | Payer: BLUE CROSS/BLUE SHIELD | Source: Ambulatory Visit | Attending: Obstetrics & Gynecology | Admitting: Obstetrics & Gynecology

## 2015-03-20 DIAGNOSIS — Z803 Family history of malignant neoplasm of breast: Secondary | ICD-10-CM

## 2015-12-29 ENCOUNTER — Other Ambulatory Visit: Payer: Self-pay | Admitting: Obstetrics & Gynecology

## 2015-12-29 DIAGNOSIS — Z1231 Encounter for screening mammogram for malignant neoplasm of breast: Secondary | ICD-10-CM

## 2015-12-29 DIAGNOSIS — Z803 Family history of malignant neoplasm of breast: Secondary | ICD-10-CM

## 2016-03-24 ENCOUNTER — Ambulatory Visit
Admission: RE | Admit: 2016-03-24 | Discharge: 2016-03-24 | Disposition: A | Discharge status: Home / Self Care | Payer: BLUE CROSS/BLUE SHIELD | Source: Ambulatory Visit | Attending: Obstetrics & Gynecology | Admitting: Obstetrics & Gynecology

## 2016-03-24 DIAGNOSIS — Z1231 Encounter for screening mammogram for malignant neoplasm of breast: Secondary | ICD-10-CM

## 2016-03-24 DIAGNOSIS — Z803 Family history of malignant neoplasm of breast: Secondary | ICD-10-CM

## 2016-03-25 ENCOUNTER — Other Ambulatory Visit: Payer: Self-pay | Admitting: Obstetrics & Gynecology

## 2016-03-25 DIAGNOSIS — N63 Unspecified lump in unspecified breast: Secondary | ICD-10-CM

## 2016-03-31 ENCOUNTER — Other Ambulatory Visit: Payer: Self-pay | Admitting: Obstetrics & Gynecology

## 2016-03-31 DIAGNOSIS — Z1231 Encounter for screening mammogram for malignant neoplasm of breast: Secondary | ICD-10-CM

## 2016-04-01 ENCOUNTER — Ambulatory Visit
Admission: RE | Admit: 2016-04-01 | Discharge: 2016-04-01 | Disposition: A | Discharge status: Home / Self Care | Payer: BLUE CROSS/BLUE SHIELD | Source: Ambulatory Visit | Attending: Obstetrics & Gynecology | Admitting: Obstetrics & Gynecology

## 2016-04-01 DIAGNOSIS — Z1231 Encounter for screening mammogram for malignant neoplasm of breast: Secondary | ICD-10-CM

## 2016-05-12 ENCOUNTER — Other Ambulatory Visit: Payer: Self-pay | Admitting: Obstetrics & Gynecology

## 2016-05-12 DIAGNOSIS — Z803 Family history of malignant neoplasm of breast: Secondary | ICD-10-CM

## 2016-05-14 ENCOUNTER — Ambulatory Visit
Admission: RE | Admit: 2016-05-14 | Discharge: 2016-05-14 | Disposition: A | Discharge status: Home / Self Care | Payer: BLUE CROSS/BLUE SHIELD | Source: Ambulatory Visit | Attending: Obstetrics & Gynecology | Admitting: Obstetrics & Gynecology

## 2016-05-14 DIAGNOSIS — Z803 Family history of malignant neoplasm of breast: Secondary | ICD-10-CM

## 2016-05-14 MED ORDER — GADOBENATE DIMEGLUMINE 529 MG/ML IV SOLN
12.0000 mL | Freq: Once | INTRAVENOUS | Status: AC | PRN
  Administered 2016-05-14: 12 mL via INTRAVENOUS

## 2017-02-03 LAB — OB RESULTS CONSOLE ABO/RH: RH Type: POSITIVE

## 2017-02-03 LAB — OB RESULTS CONSOLE GC/CHLAMYDIA
CHLAMYDIA, DNA PROBE: NEGATIVE
GC PROBE AMP, GENITAL: NEGATIVE

## 2017-02-03 LAB — OB RESULTS CONSOLE HIV ANTIBODY (ROUTINE TESTING): HIV: NONREACTIVE

## 2017-02-03 LAB — OB RESULTS CONSOLE RUBELLA ANTIBODY, IGM: Rubella: IMMUNE

## 2017-02-03 LAB — OB RESULTS CONSOLE RPR: RPR: NONREACTIVE

## 2017-02-03 LAB — OB RESULTS CONSOLE ANTIBODY SCREEN: Antibody Screen: NEGATIVE

## 2017-02-03 LAB — OB RESULTS CONSOLE HEPATITIS B SURFACE ANTIGEN: Hepatitis B Surface Ag: NEGATIVE

## 2017-06-14 NOTE — L&D Delivery Note (Signed)
Delivery Note At 2:50 PM a viable female was delivered via Vaginal, Spontaneous (Presentation: OA).  APGAR: 9, 9; weight pending.   Placenta status: S, I. 3V Cord with the following complications: none.  Cord pH: n/a  Anesthesia:  CLEA Episiotomy: None Lacerations:  Vaginal, bilateral periurethral (only left required repair) Suture Repair: 3.0 vicryl rapide Est. Blood Loss (mL):  200  Mom to postpartum.  Baby to Couplet care / Skin to Skin.  Terricka Onofrio 09/07/2017, 3:14 PM

## 2017-08-11 ENCOUNTER — Other Ambulatory Visit: Payer: Self-pay | Admitting: Obstetrics & Gynecology

## 2017-08-11 DIAGNOSIS — R2230 Localized swelling, mass and lump, unspecified upper limb: Secondary | ICD-10-CM

## 2017-08-11 DIAGNOSIS — R222 Localized swelling, mass and lump, trunk: Secondary | ICD-10-CM

## 2017-08-11 DIAGNOSIS — Z3480 Encounter for supervision of other normal pregnancy, unspecified trimester: Secondary | ICD-10-CM | POA: Diagnosis not present

## 2017-08-17 ENCOUNTER — Ambulatory Visit
Admission: RE | Admit: 2017-08-17 | Discharge: 2017-08-17 | Disposition: A | Discharge status: Home / Self Care | Payer: 59 | Source: Ambulatory Visit | Attending: Obstetrics & Gynecology | Admitting: Obstetrics & Gynecology

## 2017-08-17 ENCOUNTER — Encounter: Payer: Self-pay | Admitting: Radiology

## 2017-08-17 DIAGNOSIS — R2230 Localized swelling, mass and lump, unspecified upper limb: Secondary | ICD-10-CM

## 2017-08-17 DIAGNOSIS — R222 Localized swelling, mass and lump, trunk: Secondary | ICD-10-CM

## 2017-08-17 DIAGNOSIS — N6489 Other specified disorders of breast: Secondary | ICD-10-CM | POA: Diagnosis not present

## 2017-09-04 ENCOUNTER — Inpatient Hospital Stay (EMERGENCY_DEPARTMENT_HOSPITAL)
Admission: AD | Admit: 2017-09-04 | Discharge: 2017-09-04 | Disposition: A | Discharge status: Home / Self Care | Payer: 59 | Source: Ambulatory Visit | Attending: Obstetrics and Gynecology | Admitting: Obstetrics and Gynecology

## 2017-09-04 ENCOUNTER — Encounter (HOSPITAL_COMMUNITY): Payer: Self-pay

## 2017-09-04 DIAGNOSIS — Z3A39 39 weeks gestation of pregnancy: Secondary | ICD-10-CM | POA: Diagnosis not present

## 2017-09-04 DIAGNOSIS — Z0371 Encounter for suspected problem with amniotic cavity and membrane ruled out: Secondary | ICD-10-CM

## 2017-09-04 DIAGNOSIS — O26893 Other specified pregnancy related conditions, third trimester: Secondary | ICD-10-CM | POA: Diagnosis not present

## 2017-09-04 DIAGNOSIS — N898 Other specified noninflammatory disorders of vagina: Secondary | ICD-10-CM

## 2017-09-04 LAB — POCT FERN TEST

## 2017-09-04 LAB — AMNISURE RUPTURE OF MEMBRANE (ROM) NOT AT ARMC: AMNISURE: NEGATIVE

## 2017-09-04 NOTE — Discharge Instructions (Signed)

## 2017-09-04 NOTE — Progress Notes (Signed)
I have communicated with Dr. Marcelle OverlieHolland and reviewed vital signs:  Vitals:   09/04/17 1026  BP: 129/85  Pulse: (!) 108  Resp: 16  Temp: 97.8 F (36.6 C)    Vaginal exam:   ,   Also reviewed contraction pattern and that non-stress test is reactive.  Patient presents with possible rupture of membranes, negative fern, negative amnisure, efm strip reactive.   Based on this report provider has given order for discharge.  A discharge order and diagnosis entered by a provider.   Labor discharge instructions reviewed with patient.

## 2017-09-04 NOTE — MAU Provider Note (Signed)
Chief Complaint  Patient presents with  . Rupture of Membranes     First Provider Initiated Contact with Patient 09/04/17 1041      S: Taylor Bell  is a 37 y.o. y.o. year old 303P1011 female at 5928w1d weeks gestation who presents to MAU reporting multiple episodes of leaking a small amount of clear fluid since 09/02/2017.   Contractions: Irregular, mild Vaginal bleeding: Scant Fetal movement: Normal  O:  Patient Vitals for the past 24 hrs:  BP Temp Temp src Pulse Resp  09/04/17 1026 129/85 97.8 F (36.6 C) Oral (!) 108 16   General: NAD Heart: Regular rate Lungs: Normal rate and effort Abd: Soft, NT, Gravid, S=D Pelvic: NEFG, negative pooling, no blood.     EFM: 150, Moderate variability, 15 x 15 accelerations, no decelerations Toco: Uterine irritability  Neg Fern Neg Amnisure  A: 3928w1d week IUP No evidence of SROM, Not in labor FHR reactive  P:Discharge home in stable condition per consult w/ Richarda OverlieHolland, Richard, MD. Labor precautions and fetal kick counts. Follow-up as scheduled for prenatal visit or sooner as needed if symptoms worsen. Return to maternity admissions as needed if symptoms worsen.  Katrinka BlazingSmith, [place], CNM 09/04/2017 10:56 AM  2

## 2017-09-04 NOTE — MAU Note (Signed)
Patient presents with questionable leaking of fluid since Friday.  Clear fluid.  Had some more this morning.

## 2017-09-05 ENCOUNTER — Encounter (HOSPITAL_COMMUNITY): Payer: Self-pay | Admitting: *Deleted

## 2017-09-05 ENCOUNTER — Telehealth (HOSPITAL_COMMUNITY): Payer: Self-pay | Admitting: *Deleted

## 2017-09-05 LAB — OB RESULTS CONSOLE GBS: STREP GROUP B AG: NEGATIVE

## 2017-09-05 NOTE — Telephone Encounter (Signed)
Preadmission screen  

## 2017-09-07 ENCOUNTER — Encounter (HOSPITAL_COMMUNITY): Payer: Self-pay

## 2017-09-07 ENCOUNTER — Inpatient Hospital Stay (HOSPITAL_COMMUNITY): Payer: 59 | Admitting: Anesthesiology

## 2017-09-07 ENCOUNTER — Inpatient Hospital Stay (HOSPITAL_COMMUNITY)
Admission: RE | Admit: 2017-09-07 | Discharge: 2017-09-09 | DRG: 807 | Disposition: A | Discharge status: Home / Self Care | Payer: 59 | Source: Ambulatory Visit | Attending: Obstetrics & Gynecology | Admitting: Obstetrics & Gynecology

## 2017-09-07 DIAGNOSIS — Z3490 Encounter for supervision of normal pregnancy, unspecified, unspecified trimester: Secondary | ICD-10-CM

## 2017-09-07 DIAGNOSIS — Z3483 Encounter for supervision of other normal pregnancy, third trimester: Secondary | ICD-10-CM | POA: Diagnosis present

## 2017-09-07 DIAGNOSIS — Z87891 Personal history of nicotine dependence: Secondary | ICD-10-CM

## 2017-09-07 DIAGNOSIS — Z3A39 39 weeks gestation of pregnancy: Secondary | ICD-10-CM | POA: Diagnosis not present

## 2017-09-07 LAB — ABO/RH: ABO/RH(D): A POS

## 2017-09-07 LAB — CBC
HEMATOCRIT: 34.7 % — AB (ref 36.0–46.0)
HEMOGLOBIN: 11.6 g/dL — AB (ref 12.0–15.0)
MCH: 29.4 pg (ref 26.0–34.0)
MCHC: 33.4 g/dL (ref 30.0–36.0)
MCV: 88.1 fL (ref 78.0–100.0)
Platelets: 288 10*3/uL (ref 150–400)
RBC: 3.94 MIL/uL (ref 3.87–5.11)
RDW: 13.4 % (ref 11.5–15.5)
WBC: 7.4 10*3/uL (ref 4.0–10.5)

## 2017-09-07 LAB — TYPE AND SCREEN
ABO/RH(D): A POS
ANTIBODY SCREEN: NEGATIVE

## 2017-09-07 LAB — RPR: RPR Ser Ql: NONREACTIVE

## 2017-09-07 MED ORDER — FLEET ENEMA 7-19 GM/118ML RE ENEM: 1.0000 | ENEMA | RECTAL | Status: DC | PRN

## 2017-09-07 MED ORDER — SENNOSIDES-DOCUSATE SODIUM 8.6-50 MG PO TABS
2.0000 | ORAL_TABLET | ORAL | Status: DC
  Administered 2017-09-07 – 2017-09-08 (×2): 2 via ORAL
  Filled 2017-09-07 (×2): qty 2

## 2017-09-07 MED ORDER — ONDANSETRON HCL 4 MG/2ML IJ SOLN: 4.0000 mg | Freq: Four times a day (QID) | INTRAMUSCULAR | Status: DC | PRN

## 2017-09-07 MED ORDER — LACTATED RINGERS IV SOLN: 500.0000 mL | INTRAVENOUS | Status: DC | PRN

## 2017-09-07 MED ORDER — OXYCODONE-ACETAMINOPHEN 5-325 MG PO TABS: 2.0000 | ORAL_TABLET | ORAL | Status: DC | PRN

## 2017-09-07 MED ORDER — OXYTOCIN BOLUS FROM INFUSION
500.0000 mL | Freq: Once | INTRAVENOUS | Status: AC
  Administered 2017-09-07: 500 mL via INTRAVENOUS

## 2017-09-07 MED ORDER — EPHEDRINE 5 MG/ML INJ
10.0000 mg | INTRAVENOUS | Status: DC | PRN
  Filled 2017-09-07: qty 2

## 2017-09-07 MED ORDER — PHENYLEPHRINE 40 MCG/ML (10ML) SYRINGE FOR IV PUSH (FOR BLOOD PRESSURE SUPPORT)
80.0000 ug | PREFILLED_SYRINGE | INTRAVENOUS | Status: DC | PRN
  Filled 2017-09-07: qty 5
  Filled 2017-09-07: qty 10

## 2017-09-07 MED ORDER — ONDANSETRON HCL 4 MG PO TABS: 4.0000 mg | ORAL_TABLET | ORAL | Status: DC | PRN

## 2017-09-07 MED ORDER — PRENATAL MULTIVITAMIN CH
1.0000 | ORAL_TABLET | Freq: Every day | ORAL | Status: DC
  Administered 2017-09-08 – 2017-09-09 (×2): 1 via ORAL
  Filled 2017-09-07 (×2): qty 1

## 2017-09-07 MED ORDER — DIBUCAINE 1 % RE OINT
1.0000 "application " | TOPICAL_OINTMENT | RECTAL | Status: DC | PRN
  Administered 2017-09-07: 1 via RECTAL
  Filled 2017-09-07: qty 28

## 2017-09-07 MED ORDER — LIDOCAINE HCL (PF) 1 % IJ SOLN
30.0000 mL | INTRAMUSCULAR | Status: DC | PRN
  Filled 2017-09-07: qty 30

## 2017-09-07 MED ORDER — OXYTOCIN 40 UNITS IN LACTATED RINGERS INFUSION - SIMPLE MED: 2.5000 [IU]/h | INTRAVENOUS | Status: DC

## 2017-09-07 MED ORDER — FENTANYL 2.5 MCG/ML BUPIVACAINE 1/10 % EPIDURAL INFUSION (WH - ANES): 14.0000 mL/h | INTRAMUSCULAR | Status: DC | PRN

## 2017-09-07 MED ORDER — FENTANYL CITRATE (PF) 100 MCG/2ML IJ SOLN: 50.0000 ug | INTRAMUSCULAR | Status: DC | PRN

## 2017-09-07 MED ORDER — TETANUS-DIPHTH-ACELL PERTUSSIS 5-2.5-18.5 LF-MCG/0.5 IM SUSP: 0.5000 mL | Freq: Once | INTRAMUSCULAR | Status: DC

## 2017-09-07 MED ORDER — LACTATED RINGERS IV SOLN
500.0000 mL | Freq: Once | INTRAVENOUS | Status: AC
Start: 2017-09-07 — End: 2017-09-07
  Administered 2017-09-07: 500 mL via INTRAVENOUS

## 2017-09-07 MED ORDER — SIMETHICONE 80 MG PO CHEW: 80.0000 mg | CHEWABLE_TABLET | ORAL | Status: DC | PRN

## 2017-09-07 MED ORDER — ACETAMINOPHEN 325 MG PO TABS
650.0000 mg | ORAL_TABLET | ORAL | Status: DC | PRN
  Administered 2017-09-08 (×2): 650 mg via ORAL
  Filled 2017-09-07 (×2): qty 2

## 2017-09-07 MED ORDER — LIDOCAINE HCL (PF) 1 % IJ SOLN
INTRAMUSCULAR | Status: DC | PRN
  Administered 2017-09-07 (×2): 4 mL

## 2017-09-07 MED ORDER — ZOLPIDEM TARTRATE 5 MG PO TABS: 5.0000 mg | ORAL_TABLET | Freq: Every evening | ORAL | Status: DC | PRN

## 2017-09-07 MED ORDER — COCONUT OIL OIL
1.0000 "application " | TOPICAL_OIL | Status: DC | PRN
  Administered 2017-09-08: 1 via TOPICAL
  Filled 2017-09-07: qty 120

## 2017-09-07 MED ORDER — LACTATED RINGERS IV SOLN
INTRAVENOUS | Status: DC
  Administered 2017-09-07 (×2): via INTRAVENOUS

## 2017-09-07 MED ORDER — ONDANSETRON HCL 4 MG/2ML IJ SOLN: 4.0000 mg | INTRAMUSCULAR | Status: DC | PRN

## 2017-09-07 MED ORDER — SOD CITRATE-CITRIC ACID 500-334 MG/5ML PO SOLN: 30.0000 mL | ORAL | Status: DC | PRN

## 2017-09-07 MED ORDER — DIPHENHYDRAMINE HCL 25 MG PO CAPS: 25.0000 mg | ORAL_CAPSULE | Freq: Four times a day (QID) | ORAL | Status: DC | PRN

## 2017-09-07 MED ORDER — PHENYLEPHRINE 40 MCG/ML (10ML) SYRINGE FOR IV PUSH (FOR BLOOD PRESSURE SUPPORT)
80.0000 ug | PREFILLED_SYRINGE | INTRAVENOUS | Status: DC | PRN
  Filled 2017-09-07: qty 5

## 2017-09-07 MED ORDER — OXYCODONE-ACETAMINOPHEN 5-325 MG PO TABS: 1.0000 | ORAL_TABLET | ORAL | Status: DC | PRN

## 2017-09-07 MED ORDER — IBUPROFEN 600 MG PO TABS
600.0000 mg | ORAL_TABLET | Freq: Four times a day (QID) | ORAL | Status: DC
  Administered 2017-09-07 – 2017-09-09 (×8): 600 mg via ORAL
  Filled 2017-09-07 (×8): qty 1

## 2017-09-07 MED ORDER — WITCH HAZEL-GLYCERIN EX PADS
1.0000 "application " | MEDICATED_PAD | CUTANEOUS | Status: DC | PRN
  Administered 2017-09-07: 1 via TOPICAL

## 2017-09-07 MED ORDER — OXYTOCIN 40 UNITS IN LACTATED RINGERS INFUSION - SIMPLE MED
1.0000 m[IU]/min | INTRAVENOUS | Status: DC
  Administered 2017-09-07: 2 m[IU]/min via INTRAVENOUS
  Filled 2017-09-07: qty 1000

## 2017-09-07 MED ORDER — DIPHENHYDRAMINE HCL 50 MG/ML IJ SOLN: 12.5000 mg | INTRAMUSCULAR | Status: DC | PRN

## 2017-09-07 MED ORDER — ACETAMINOPHEN 325 MG PO TABS: 650.0000 mg | ORAL_TABLET | ORAL | Status: DC | PRN

## 2017-09-07 MED ORDER — BENZOCAINE-MENTHOL 20-0.5 % EX AERO
1.0000 "application " | INHALATION_SPRAY | CUTANEOUS | Status: DC | PRN
  Administered 2017-09-07: 1 via TOPICAL
  Filled 2017-09-07: qty 56

## 2017-09-07 MED ORDER — TERBUTALINE SULFATE 1 MG/ML IJ SOLN
0.2500 mg | Freq: Once | INTRAMUSCULAR | Status: DC | PRN
  Filled 2017-09-07: qty 1

## 2017-09-07 MED ORDER — FENTANYL 2.5 MCG/ML BUPIVACAINE 1/10 % EPIDURAL INFUSION (WH - ANES)
14.0000 mL/h | INTRAMUSCULAR | Status: DC | PRN
  Administered 2017-09-07: 14 mL/h via EPIDURAL
  Filled 2017-09-07: qty 100

## 2017-09-07 MED ORDER — ESCITALOPRAM OXALATE 10 MG PO TABS
10.0000 mg | ORAL_TABLET | Freq: Every day | ORAL | Status: DC
  Filled 2017-09-07 (×4): qty 1

## 2017-09-07 NOTE — Plan of Care (Signed)
  Problem: Education: Goal: Knowledge of condition will improve Outcome: Progressing   Problem: Activity: Goal: Ability to tolerate increased activity will improve Outcome: Progressing Note:  Pt ambulated to bathroom with stand by assist without complications.  Informed pt that she is able to ambulate in room ad lib & to notify RN if complications.   Problem: Life Cycle: Goal: Chance of risk for complications during the postpartum period will decrease Outcome: Progressing Note:  Fundas firm & midline when pt keeps bladder empty.  Perineum swollen & bruised.  Pt denies significant pain at perineum & is instructed to inform RN if increased pain or pressure.  Educated  on perineal care & to keep ice to perineum x 24hrs.   Problem: Role Relationship: Goal: Ability to demonstrate positive interaction with newborn will improve Outcome: Progressing

## 2017-09-07 NOTE — Anesthesia Procedure Notes (Signed)
Epidural Patient location during procedure: OB Start time: 09/07/2017 8:46 AM End time: 09/07/2017 8:55 AM  Staffing Anesthesiologist: Lewie LoronGermeroth, Lynton Crescenzo, MD Performed: anesthesiologist   Preanesthetic Checklist Completed: patient identified, pre-op evaluation, timeout performed, IV checked, risks and benefits discussed and monitors and equipment checked  Epidural Patient position: sitting Prep: site prepped and draped and DuraPrep Patient monitoring: heart rate, continuous pulse ox and blood pressure Approach: midline Location: L3-L4 Injection technique: LOR air and LOR saline  Needle:  Needle type: Tuohy  Needle gauge: 17 G Needle length: 9 cm Needle insertion depth: 6 cm Catheter type: closed end flexible Catheter size: 19 Gauge Catheter at skin depth: 11 cm Test dose: negative  Assessment Sensory level: T8 Events: blood not aspirated, injection not painful, no injection resistance, negative IV test and no paresthesia  Additional Notes Reason for block:procedure for pain

## 2017-09-07 NOTE — H&P (Signed)
Taylor Bell is a 37 y.o. female presenting for elective induction of labor.  Antepartum course uncomplicated.  Patient is AMA with normal NIPS.  GBS negative.  OB History    Gravida  3   Para  1   Term  1   Preterm  0   AB  1   Living  1     SAB  1   TAB  0   Ectopic  0   Multiple  0   Live Births  1          Past Medical History:  Diagnosis Date  . AMA (advanced maternal age) multigravida 35+   . Anxiety    lexapro post partum anxiety   Past Surgical History:  Procedure Laterality Date  . DILATION AND CURETTAGE OF UTERUS    . GUM SURGERY  2009  . WISDOM TOOTH EXTRACTION  2000   Family History: family history includes Breast cancer in her mother; Diabetes in her father; Heart attack in her paternal grandfather; Hypertension in her maternal aunt. Social History:  reports that she quit smoking about a year ago. She has never used smokeless tobacco. She reports that she does not drink alcohol or use drugs.     Maternal Diabetes: No Genetic Screening: Normal Maternal Ultrasounds/Referrals: Normal Fetal Ultrasounds or other Referrals:  None Maternal Substance Abuse:  No Significant Maternal Medications:  None Significant Maternal Lab Results:  Lab values include: Group B Strep negative Other Comments:  None  ROS Maternal Medical History:  Fetal activity: Perceived fetal activity is normal.   Last perceived fetal movement was within the past hour.    Prenatal complications: no prenatal complications Prenatal Complications - Diabetes: none.    Dilation: 2.5 Effacement (%): 70 Station: -2 Exam by:: S Nix RN Blood pressure 130/78, pulse 99, temperature 98.2 F (36.8 C), temperature source Oral, resp. rate 16, height 5\' 5"  (1.651 m), weight 173 lb 12.8 oz (78.8 kg), unknown if currently breastfeeding. Maternal Exam:  Abdomen: Patient reports no abdominal tenderness. Fundal height is c/w dates.   Estimated fetal weight is 8#.       Physical Exam   Constitutional: She is oriented to person, place, and time. She appears well-developed and well-nourished.  GI: Soft. There is no rebound and no guarding.  Neurological: She is alert and oriented to person, place, and time.  Skin: Skin is warm and dry.  Psychiatric: She has a normal mood and affect. Her behavior is normal.    Prenatal labs: ABO, Rh: A/Positive/-- (08/23 0000) Antibody: Negative (08/23 0000) Rubella: Immune (08/23 0000) RPR: Nonreactive (08/23 0000)  HBsAg: Negative (08/23 0000)  HIV: Non-reactive (08/23 0000)  GBS: Negative (03/25 0000)   Assessment/Plan: 36yo G3P1011 at 39w for elective IOL -Pitocin started -Patient plans epidural -AROM when comfortable -Anticipate NSVD   Elfa Wooton 09/07/2017, 8:27 AM

## 2017-09-07 NOTE — Anesthesia Postprocedure Evaluation (Signed)
Anesthesia Post Note  Patient: Taylor PernaHolly E Bell  Procedure(s) Performed: AN AD HOC LABOR EPIDURAL     Patient location during evaluation: Mother Baby Anesthesia Type: Epidural Level of consciousness: awake and alert and oriented Pain management: satisfactory to patient Vital Signs Assessment: post-procedure vital signs reviewed and stable Respiratory status: respiratory function stable Cardiovascular status: stable Postop Assessment: no headache, no backache, epidural receding, patient able to bend at knees, no signs of nausea or vomiting and adequate PO intake Anesthetic complications: no    Last Vitals:  Vitals:   09/07/17 1601 09/07/17 1650  BP: 119/89 123/79  Pulse: 85 (!) 104  Resp: 16 18  Temp:  37.2 C  SpO2:  99%    Last Pain:  Vitals:   09/07/17 1658  TempSrc:   PainSc: 3    Pain Goal:                 Shloimy Michalski

## 2017-09-07 NOTE — Anesthesia Preprocedure Evaluation (Signed)
Anesthesia Evaluation  Patient identified by MRN, date of birth, ID band Patient awake    Reviewed: Allergy & Precautions, H&P , Patient's Chart, lab work & pertinent test results  Airway Mallampati: III  TM Distance: >3 FB Neck ROM: full    Dental no notable dental hx. (+) Teeth Intact   Pulmonary neg pulmonary ROS, Current Smoker, former smoker,    Pulmonary exam normal breath sounds clear to auscultation       Cardiovascular negative cardio ROS   Rhythm:regular Rate:Normal     Neuro/Psych negative neurological ROS  negative psych ROS   GI/Hepatic Neg liver ROS, GERD  Medicated and Controlled,  Endo/Other  negative endocrine ROS  Renal/GU negative Renal ROS  negative genitourinary   Musculoskeletal   Abdominal Normal abdominal exam  (+)   Peds  Hematology negative hematology ROS (+)   Anesthesia Other Findings   Reproductive/Obstetrics (+) Pregnancy                             Anesthesia Physical  Anesthesia Plan  ASA: II  Anesthesia Plan: Epidural   Post-op Pain Management:    Induction:   PONV Risk Score and Plan:   Airway Management Planned:   Additional Equipment:   Intra-op Plan:   Post-operative Plan:   Informed Consent: I have reviewed the patients History and Physical, chart, labs and discussed the procedure including the risks, benefits and alternatives for the proposed anesthesia with the patient or authorized representative who has indicated his/her understanding and acceptance.     Plan Discussed with: Anesthesiologist  Anesthesia Plan Comments:         Anesthesia Quick Evaluation

## 2017-09-07 NOTE — Anesthesia Pain Management Evaluation Note (Signed)
  CRNA Pain Management Visit Note  Patient: Taylor Bell, 37 y.o., female  "Hello I am a member of the anesthesia team at [facility]. We have an anesthesia team available at all times to provide care throughout the hospital, including epidural management and anesthesia for C-section. I don't know your plan for the delivery whether it a natural birth, water birth, IV sedation, nitrous supplementation, doula or epidural, but we want to meet your pain goals."   1.Was your pain managed to your expectations on prior hospitalizations?   Yes   2.What is your expectation for pain management during this hospitalization?     Epidural  3.How can we help you reach that goal? Epidural @ pain goal.  Record the patient's initial score and the patient's pain goal.   Pain: 2  Pain Goal: 5 [facility] wants you to be able to say your pain was always managed very well.  Zacharia Sowles 09/07/2017

## 2017-09-08 ENCOUNTER — Other Ambulatory Visit: Payer: Self-pay

## 2017-09-08 LAB — CBC
HCT: 31 % — ABNORMAL LOW (ref 36.0–46.0)
Hemoglobin: 10.5 g/dL — ABNORMAL LOW (ref 12.0–15.0)
MCH: 30.2 pg (ref 26.0–34.0)
MCHC: 33.9 g/dL (ref 30.0–36.0)
MCV: 89.1 fL (ref 78.0–100.0)
PLATELETS: 251 10*3/uL (ref 150–400)
RBC: 3.48 MIL/uL — AB (ref 3.87–5.11)
RDW: 13.8 % (ref 11.5–15.5)
WBC: 11.4 10*3/uL — ABNORMAL HIGH (ref 4.0–10.5)

## 2017-09-08 NOTE — Progress Notes (Signed)
Post Partum Day 1 Subjective: no complaints, up ad lib, voiding and tolerating PO  Objective: Blood pressure 117/82, pulse 100, temperature 98.2 F (36.8 C), temperature source Oral, resp. rate 16, height 5\' 5"  (1.651 m), weight 78.8 kg (173 lb 12.8 oz), SpO2 100 %, unknown if currently breastfeeding.  Physical Exam:  General: alert, cooperative and appears stated age Lochia: appropriate Uterine Fundus: firm Incision: n/a DVT Evaluation: No evidence of DVT seen on physical exam. Negative Homan's sign. No cords or calf tenderness. No significant calf/ankle edema.  Recent Labs    09/07/17 0800 09/08/17 0530  HGB 11.6* 10.5*  HCT 34.7* 31.0*    Assessment/Plan: Plan for discharge tomorrow, Breastfeeding and Circumcision prior to discharge D/W patient female infant circumcision, risks/benefits reviewed. All questions answered.   LOS: 1 day   Ranae Pilalise Jennifer Leger 09/08/2017, 9:20 AM   Baby not ready for circ yet - not feeding well and losing weight per RN. Will continue to re-eval and circ when able.   Rosie FateE Leger MD

## 2017-09-08 NOTE — Lactation Note (Signed)
This note was copied from a baby's chart. Lactation Consultation Note Baby 12 hrs old. Mom has been asking for latch assistance.mom has great everted nipples, compressible breast, colostrum w/hand expression. Mom holding baby STS after bath. Baby sleeping. Mom states to LC BF going well.  Mom BF her 805 yr old for over a yr w/o difficulty. Mom has a Spectra DEBP at home. Asked to be fitted for flanges. Asked mom if she would like a hand pump, mom stated yes, hand pump given w/#24 flange appears that it would be a good fit, unable to pump and see at this time. Mom requested #27 flange as well to see which fits best. Gave mom booklet "Personal Fit" on flanges by [employer]. Encouraged mom to call for questions or assessment. Reviewed newborn behavior, feeding habits, STS, I&O, cluster feeding, breast massage, and pumping. Mom encouraged to feed baby 8-12 times/24 hours and with feeding cues.  [facility]/LC brochure given w/resources, support groups and LC services.  Patient Name: Taylor Bell Today's Date: 09/08/2017 Reason for consult: Initial assessment   Maternal Data Has patient been taught Hand Expression?: Yes Does the patient have breastfeeding experience prior to this delivery?: Yes  Feeding    LATCH Score       Type of Nipple: Everted at rest and after stimulation  Comfort (Breast/Nipple): Soft / non-tender        Interventions Interventions: Breast feeding basics reviewed;Skin to skin;Breast massage;Hand express;Breast compression;Hand pump  Lactation Tools Discussed/Used Tools: Pump;Flanges Flange Size: 24;27(mom wanted to try 27 w/pump as well as 24) Breast pump type: Manual WIC Program: No Pump Review: Setup, frequency, and cleaning;Milk Storage Initiated by:: Peri JeffersonL. Kirsten Spearing RN IBCLC Date initiated:: 09/08/17   Consult Status Consult Status: Follow-up Date: 09/08/17 Follow-up type: In-patient    Charyl DancerCARVER, [name] 09/08/2017, 3:45 AM

## 2017-09-08 NOTE — Lactation Note (Signed)
This note was copied from a baby's chart. Lactation Consultation Note  Patient Name: Taylor Bell ZOXWR'UToday's Date: 09/08/2017   Lactation visit attempted, but visitor present in room. Mom says she will request lactation later, if needed.   Mom is on Lexapro 10mg  qd (L2).  Taylor Bell, [facility] Bell [facility] 09/08/2017, 7:42 PM

## 2017-09-09 MED ORDER — IBUPROFEN 600 MG PO TABS: 600.0000 mg | ORAL_TABLET | Freq: Four times a day (QID) | ORAL | 0 refills | Status: AC

## 2017-09-09 NOTE — Discharge Summary (Signed)
Obstetric Discharge Summary Reason for Admission: induction of labor Prenatal Procedures: none Intrapartum Procedures: spontaneous vaginal delivery Postpartum Procedures: none Complications-Operative and Postpartum: none Hemoglobin  Date Value Ref Range Status  09/08/2017 10.5 (L) 12.0 - 15.0 g/dL Final   HCT  Date Value Ref Range Status  09/08/2017 31.0 (L) 36.0 - 46.0 % Final    Physical Exam:  General: alert and cooperative Lochia: appropriate Uterine Fundus: firm Incision: healing well, no significant drainage DVT Evaluation: No evidence of DVT seen on physical exam.  Discharge Diagnoses: Term Pregnancy-delivered  Discharge Information: Date: 09/09/2017 Activity: pelvic rest Diet: routine Medications: PNV and Ibuprofen Condition: stable Instructions: refer to practice specific booklet Discharge to: home Follow-up Information    [place], [facility] Schedule an appointment as soon as possible for a visit in 6 week(s).   Contact information: [address] [place] [address] [phone]           Newborn Data: Live born female  Birth Weight: 7 lb 13.4 oz (3555 g) APGAR: 9, 9  Newborn Delivery   Birth date/time:  09/07/2017 14:50:00 Delivery type:  Vaginal, Spontaneous     Home with mother.  Zelphia CairoGretchen Traevon Meiring 09/09/2017, 8:25 AM

## 2017-09-09 NOTE — Progress Notes (Signed)
Post Partum Day 1 Subjective: no complaints  Objective: Blood pressure 109/67, pulse 96, temperature 98.3 F (36.8 C), resp. rate 18, height 5\' 5"  (1.651 m), weight 173 lb 12.8 oz (78.8 kg), SpO2 98 %, unknown if currently breastfeeding.  Physical Exam:  General: alert and cooperative Lochia: appropriate Uterine Fundus: firm Incision: healing well, no significant drainage DVT Evaluation: No evidence of DVT seen on physical exam.  Recent Labs    09/07/17 0800 09/08/17 0530  HGB 11.6* 10.5*  HCT 34.7* 31.0*    Assessment/Plan: Discharge home and Breastfeeding  Circumcision prior to d/c   LOS: 2 days   Taylor Bell 09/09/2017, 8:22 AM

## 2017-09-09 NOTE — Lactation Note (Signed)
This note was copied from a baby's chart. Lactation Consultation Note  Patient Name: Taylor Bell ZOXWR'UToday's Date: 09/09/2017 Reason for consult: Follow-up assessment;Term  Term baby at 7541 hrs old, day of discharge. Baby 6.5% weight loss with adequate output.  Baby breastfeeding in cradle hold, STS.  Latched well, with deep jaw extensions and swallows.  Mom states her nipples are a little sore on initial latch, using coconut oil.  Suggested she use cross cradle for first few weeks, rather than cradle, to better to control baby's latch to breast.  Reviewed alternate breast compression during sucking.  Baby to be circumcised today.  Reassured Mom about sleepiness afterwards, and to keep baby STS as much as possible.  Encouraged STS, and feeding baby often on cue.  Goal of 8-12 feedings per 24 hrs.   Reminded Mom of OP lactation support post discharge.    Feeding Feeding Type: Breast Fed Length of feed: 30 min  LATCH Score Latch: Grasps breast easily, tongue down, lips flanged, rhythmical sucking.  Audible Swallowing: Spontaneous and intermittent  Type of Nipple: Everted at rest and after stimulation  Comfort (Breast/Nipple): Filling, red/small blisters or bruises, mild/mod discomfort  Hold (Positioning): Assistance needed to correctly position infant at breast and maintain latch.  LATCH Score: 8  Interventions Interventions: Breast feeding basics reviewed;Assisted with latch;Skin to skin;Breast massage;Hand express;Adjust position;Breast compression;Support pillows;Position options   Consult Status Consult Status: Complete Date: 09/09/17 Follow-up type: In-patient    Judee ClaraSmith, Garrick Midgley E 09/09/2017, 8:09 AM

## 2017-10-10 DIAGNOSIS — Z029 Encounter for administrative examinations, unspecified: Secondary | ICD-10-CM | POA: Diagnosis not present

## 2017-10-18 DIAGNOSIS — Z1389 Encounter for screening for other disorder: Secondary | ICD-10-CM | POA: Diagnosis not present

## 2017-12-03 ENCOUNTER — Encounter (HOSPITAL_COMMUNITY): Payer: Self-pay

## 2018-01-24 MED FILL — SHIPPING COST: 1 days supply | Qty: 1 | Fill #0

## 2018-01-24 MED FILL — SERTRALINE HCL 25 MG TABLET: 25 | 30 days supply | Qty: 60 | Fill #0

## 2018-02-21 DIAGNOSIS — F53 Postpartum depression: Secondary | ICD-10-CM | POA: Diagnosis not present

## 2018-03-03 MED FILL — SERTRALINE HCL 25 MG TABLET: 25 | 30 days supply | Qty: 60 | Fill #1

## 2018-03-03 MED FILL — SHIPPING COST: 1 days supply | Qty: 1 | Fill #1

## 2018-03-20 DIAGNOSIS — H5213 Myopia, bilateral: Secondary | ICD-10-CM | POA: Diagnosis not present

## 2018-03-30 DIAGNOSIS — L508 Other urticaria: Secondary | ICD-10-CM | POA: Diagnosis not present

## 2018-03-30 DIAGNOSIS — F53 Postpartum depression: Secondary | ICD-10-CM | POA: Diagnosis not present

## 2018-03-30 DIAGNOSIS — K219 Gastro-esophageal reflux disease without esophagitis: Secondary | ICD-10-CM | POA: Diagnosis not present

## 2018-03-30 DIAGNOSIS — Z0000 Encounter for general adult medical examination without abnormal findings: Secondary | ICD-10-CM | POA: Diagnosis not present

## 2018-03-30 DIAGNOSIS — Z803 Family history of malignant neoplasm of breast: Secondary | ICD-10-CM | POA: Diagnosis not present

## 2018-04-03 MED FILL — SERTRALINE HCL 25 MG TABLET: 25 | 30 days supply | Qty: 60 | Fill #2

## 2018-04-03 MED FILL — SHIPPING COST: 1 days supply | Qty: 1 | Fill #2

## 2018-05-03 MED FILL — SERTRALINE HCL 25 MG TABLET: 25 | 30 days supply | Qty: 60 | Fill #3

## 2018-05-03 MED FILL — SHIPPING COST: 1 days supply | Qty: 1 | Fill #3

## 2018-05-31 MED FILL — SHIPPING COST: 1 days supply | Qty: 1 | Fill #4

## 2018-05-31 MED FILL — SERTRALINE HCL 25 MG TABLET: 25 | 30 days supply | Qty: 60 | Fill #4

## 2018-07-07 MED FILL — SERTRALINE HCL 25 MG TABLET: 25 | 30 days supply | Qty: 60 | Fill #5

## 2018-07-07 MED FILL — SHIPPING COST: 1 days supply | Qty: 1 | Fill #5

## 2018-08-01 MED FILL — SHIPPING COST: 1 days supply | Qty: 1 | Fill #6

## 2018-08-01 MED FILL — SERTRALINE HCL 25 MG TABLET: 25 | 30 days supply | Qty: 60 | Fill #6

## 2018-08-28 MED FILL — SERTRALINE HCL 25 MG TABLET: 25 | 30 days supply | Qty: 60 | Fill #7

## 2018-08-28 MED FILL — SHIPPING COST: 1 days supply | Qty: 1 | Fill #7

## 2018-09-26 MED FILL — SERTRALINE HCL 25 MG TABLET: 25 | 30 days supply | Qty: 60 | Fill #8

## 2018-10-24 MED FILL — SERTRALINE HCL 25 MG TABLET: 25 | 30 days supply | Qty: 60 | Fill #9

## 2018-11-27 MED FILL — SERTRALINE HCL 50 MG TABLET: 50 | 30 days supply | Qty: 30 | Fill #0

## 2018-11-28 DIAGNOSIS — F411 Generalized anxiety disorder: Secondary | ICD-10-CM | POA: Diagnosis not present

## 2018-12-11 DIAGNOSIS — Z01419 Encounter for gynecological examination (general) (routine) without abnormal findings: Secondary | ICD-10-CM | POA: Diagnosis not present

## 2018-12-11 DIAGNOSIS — Z6820 Body mass index (BMI) 20.0-20.9, adult: Secondary | ICD-10-CM | POA: Diagnosis not present

## 2018-12-12 DIAGNOSIS — F411 Generalized anxiety disorder: Secondary | ICD-10-CM | POA: Diagnosis not present

## 2018-12-20 DIAGNOSIS — F411 Generalized anxiety disorder: Secondary | ICD-10-CM | POA: Diagnosis not present

## 2018-12-26 DIAGNOSIS — F411 Generalized anxiety disorder: Secondary | ICD-10-CM | POA: Diagnosis not present

## 2018-12-26 MED FILL — SERTRALINE HCL 50 MG TABLET: 50 | 30 days supply | Qty: 30 | Fill #1

## 2018-12-27 DIAGNOSIS — F411 Generalized anxiety disorder: Secondary | ICD-10-CM | POA: Diagnosis not present

## 2018-12-28 DIAGNOSIS — F411 Generalized anxiety disorder: Secondary | ICD-10-CM | POA: Diagnosis not present

## 2019-01-02 DIAGNOSIS — F411 Generalized anxiety disorder: Secondary | ICD-10-CM | POA: Diagnosis not present

## 2019-01-04 DIAGNOSIS — F411 Generalized anxiety disorder: Secondary | ICD-10-CM | POA: Diagnosis not present

## 2019-01-09 DIAGNOSIS — F411 Generalized anxiety disorder: Secondary | ICD-10-CM | POA: Diagnosis not present

## 2019-01-11 DIAGNOSIS — F411 Generalized anxiety disorder: Secondary | ICD-10-CM | POA: Diagnosis not present

## 2019-01-16 DIAGNOSIS — F411 Generalized anxiety disorder: Secondary | ICD-10-CM | POA: Diagnosis not present

## 2019-01-18 DIAGNOSIS — F411 Generalized anxiety disorder: Secondary | ICD-10-CM | POA: Diagnosis not present

## 2019-01-22 MED FILL — SERTRALINE HCL 50 MG TABLET: 50 | 30 days supply | Qty: 30 | Fill #2

## 2019-01-30 DIAGNOSIS — F411 Generalized anxiety disorder: Secondary | ICD-10-CM | POA: Diagnosis not present

## 2019-02-01 DIAGNOSIS — F411 Generalized anxiety disorder: Secondary | ICD-10-CM | POA: Diagnosis not present

## 2019-02-03 DIAGNOSIS — F411 Generalized anxiety disorder: Secondary | ICD-10-CM | POA: Diagnosis not present

## 2019-02-06 DIAGNOSIS — F411 Generalized anxiety disorder: Secondary | ICD-10-CM | POA: Diagnosis not present

## 2019-02-08 DIAGNOSIS — F411 Generalized anxiety disorder: Secondary | ICD-10-CM | POA: Diagnosis not present

## 2019-02-13 DIAGNOSIS — F411 Generalized anxiety disorder: Secondary | ICD-10-CM | POA: Diagnosis not present

## 2019-02-15 DIAGNOSIS — F411 Generalized anxiety disorder: Secondary | ICD-10-CM | POA: Diagnosis not present

## 2019-02-20 DIAGNOSIS — F411 Generalized anxiety disorder: Secondary | ICD-10-CM | POA: Diagnosis not present

## 2019-02-20 MED FILL — SERTRALINE HCL 50 MG TABLET: 50 | 30 days supply | Qty: 30 | Fill #0

## 2019-02-22 DIAGNOSIS — F411 Generalized anxiety disorder: Secondary | ICD-10-CM | POA: Diagnosis not present

## 2019-02-27 DIAGNOSIS — F411 Generalized anxiety disorder: Secondary | ICD-10-CM | POA: Diagnosis not present

## 2019-03-01 DIAGNOSIS — F411 Generalized anxiety disorder: Secondary | ICD-10-CM | POA: Diagnosis not present

## 2019-03-08 DIAGNOSIS — F411 Generalized anxiety disorder: Secondary | ICD-10-CM | POA: Diagnosis not present

## 2019-03-20 MED FILL — SERTRALINE HCL 50 MG TABLET: 50 | 30 days supply | Qty: 30 | Fill #1

## 2019-03-21 DIAGNOSIS — F411 Generalized anxiety disorder: Secondary | ICD-10-CM | POA: Diagnosis not present

## 2019-03-29 DIAGNOSIS — F411 Generalized anxiety disorder: Secondary | ICD-10-CM | POA: Diagnosis not present

## 2019-03-29 DIAGNOSIS — H5213 Myopia, bilateral: Secondary | ICD-10-CM | POA: Diagnosis not present

## 2019-04-02 DIAGNOSIS — O99345 Other mental disorders complicating the puerperium: Secondary | ICD-10-CM | POA: Diagnosis not present

## 2019-04-02 DIAGNOSIS — F53 Postpartum depression: Secondary | ICD-10-CM | POA: Diagnosis not present

## 2019-04-02 DIAGNOSIS — L508 Other urticaria: Secondary | ICD-10-CM | POA: Diagnosis not present

## 2019-04-02 DIAGNOSIS — Z0000 Encounter for general adult medical examination without abnormal findings: Secondary | ICD-10-CM | POA: Diagnosis not present

## 2019-04-02 DIAGNOSIS — K219 Gastro-esophageal reflux disease without esophagitis: Secondary | ICD-10-CM | POA: Diagnosis not present

## 2019-04-02 DIAGNOSIS — Z803 Family history of malignant neoplasm of breast: Secondary | ICD-10-CM | POA: Diagnosis not present

## 2019-04-05 DIAGNOSIS — F411 Generalized anxiety disorder: Secondary | ICD-10-CM | POA: Diagnosis not present

## 2019-04-12 DIAGNOSIS — F411 Generalized anxiety disorder: Secondary | ICD-10-CM | POA: Diagnosis not present

## 2019-04-20 MED FILL — SERTRALINE HCL 50 MG TABLET: 50 | 30 days supply | Qty: 30 | Fill #2

## 2019-04-26 DIAGNOSIS — F411 Generalized anxiety disorder: Secondary | ICD-10-CM | POA: Diagnosis not present

## 2019-05-03 DIAGNOSIS — F411 Generalized anxiety disorder: Secondary | ICD-10-CM | POA: Diagnosis not present

## 2019-05-17 DIAGNOSIS — F411 Generalized anxiety disorder: Secondary | ICD-10-CM | POA: Diagnosis not present

## 2019-05-21 MED FILL — SERTRALINE HCL 50 MG TABLET: 50 | 30 days supply | Qty: 30 | Fill #0

## 2019-05-24 DIAGNOSIS — F411 Generalized anxiety disorder: Secondary | ICD-10-CM | POA: Diagnosis not present

## 2019-05-31 DIAGNOSIS — F411 Generalized anxiety disorder: Secondary | ICD-10-CM | POA: Diagnosis not present

## 2019-06-05 ENCOUNTER — Other Ambulatory Visit: Payer: 59

## 2019-06-21 MED FILL — SERTRALINE HCL 50 MG TABLET: 50 | 30 days supply | Qty: 30 | Fill #1

## 2019-07-18 MED FILL — SERTRALINE HCL 50 MG TABLET: 50 | 30 days supply | Qty: 30 | Fill #2

## 2019-07-22 ENCOUNTER — Emergency Department (INDEPENDENT_AMBULATORY_CARE_PROVIDER_SITE_OTHER)
Admission: EM | Admit: 2019-07-22 | Discharge: 2019-07-22 | Disposition: A | Discharge status: Home / Self Care | Payer: 59 | Source: Home / Self Care

## 2019-07-22 ENCOUNTER — Emergency Department (INDEPENDENT_AMBULATORY_CARE_PROVIDER_SITE_OTHER): Payer: 59

## 2019-07-22 ENCOUNTER — Other Ambulatory Visit: Payer: Self-pay

## 2019-07-22 ENCOUNTER — Encounter: Payer: Self-pay | Admitting: Family Medicine

## 2019-07-22 DIAGNOSIS — S93502A Unspecified sprain of left great toe, initial encounter: Secondary | ICD-10-CM

## 2019-07-22 DIAGNOSIS — M79672 Pain in left foot: Secondary | ICD-10-CM

## 2019-07-22 DIAGNOSIS — S99922A Unspecified injury of left foot, initial encounter: Secondary | ICD-10-CM | POA: Diagnosis not present

## 2019-07-22 MED ORDER — HYDROCODONE-ACETAMINOPHEN 5-325 MG PO TABS: 1.0000 | ORAL_TABLET | Freq: Four times a day (QID) | ORAL | 0 refills | Status: DC | PRN

## 2019-07-22 NOTE — ED Provider Notes (Addendum)
[facility]    CSN: [id number] Arrival date & time: 07/22/19  1024      History   Chief Complaint Chief Complaint  Patient presents with  . Foot Pain    HPI Taylor Bell is a 39 y.o. female.   This is the initial [place] urgent care visit (first visit in over 3 years) for this 39 year old woman complaining of left foot pain.     Past Medical History:  Diagnosis Date  . AMA (advanced maternal age) multigravida 35+   . Anxiety    lexapro post partum anxiety    Patient Active Problem List   Diagnosis Date Noted  . Pregnancy 09/07/2017  . Normal pregnancy 08/21/2013  . DERMATITIS 04/01/2010  . CONTACT DERMATITIS, EYELID 04/18/2009    Past Surgical History:  Procedure Laterality Date  . DILATION AND CURETTAGE OF UTERUS    . GUM SURGERY  2009  . WISDOM TOOTH EXTRACTION  2000    OB History    Gravida  3   Para  1   Term  1   Preterm  0   AB  1   Living  1     SAB  1   TAB  0   Ectopic  0   Multiple  0   Live Births  1            Home Medications    Prior to Admission medications   Medication Sig Start Date End Date Taking? Authorizing Provider  ibuprofen (ADVIL,MOTRIN) 600 MG tablet Take 1 tablet (600 mg total) by mouth every 6 (six) hours. 09/09/17  Yes Zelphia Cairo, MD  sertraline (ZOLOFT) 50 MG tablet sertraline 50 mg tablet  TAKE 1 TABLET BY MOUTH ONCE A DAY 04/02/19  Yes [provider]  HYDROcodone-acetaminophen (NORCO) 5-325 MG tablet Take 1 tablet by mouth every 6 (six) hours as needed for moderate pain. 07/22/19   Elvina Sidle, MD  Prenatal Vit-Fe Fumarate-FA (PRENATAL MULTIVITAMIN) TABS tablet Take 1 tablet by mouth at bedtime.    [provider]    Family History Family History  Problem Relation Age of Onset  . Breast cancer Mother   . Diabetes Father   . Hypertension Maternal Aunt   . Heart attack Paternal Grandfather     Social History Social History   Tobacco Use  .  Smoking status: Former Smoker    Quit date: 09/05/2016    Years since quitting: 2.8  . Smokeless tobacco: Never Used  Substance Use Topics  . Alcohol use: No  . Drug use: No     Allergies   Patient has no known allergies.   Review of Systems Review of Systems   Physical Exam Triage Vital Signs ED Triage Vitals  Enc Vitals Group     BP      Pulse      Resp      Temp      Temp src      SpO2      Weight      Height      Head Circumference      Peak Flow      Pain Score      Pain Loc      Pain Edu?      Excl. in GC?    No data found.  Updated Vital Signs BP (!) 146/89 (BP Location: Right Arm)   Pulse (!) 104   Temp 98.5 F (36.9 C) (Oral)  Ht 5\' 5"  (1.651 m)   Wt 54.1 kg   LMP 07/17/2019   SpO2 97%   BMI 19.84 kg/m    Physical Exam Vitals and nursing note reviewed.  Constitutional:      General: She is not in acute distress.    Appearance: Normal appearance. She is normal weight. She is not ill-appearing.  HENT:     Head: Normocephalic.  Eyes:     Conjunctiva/sclera: Conjunctivae normal.  Cardiovascular:     Rate and Rhythm: Tachycardia present.  Pulmonary:     Effort: Pulmonary effort is normal.  Musculoskeletal:        General: Swelling, tenderness and signs of injury present. No deformity.     Cervical back: Normal range of motion and neck supple.     Right lower leg: No edema.     Left lower leg: No edema.     Comments: Tender PIP joint of left great toe with early ecchymosis and mild swelling  Skin:    General: Skin is warm and dry.     Findings: Bruising present.  Neurological:     General: No focal deficit present.     Mental Status: She is alert.     Gait: Gait abnormal.      UC Treatments / Results    Radiology Preliminary reading of left foot shows no fx.    Medications Ordered in UC Given ibuprofen 400 mg po here  Initial Impression / Assessment and Plan / UC Course  I have reviewed the triage vital signs and the  nursing notes.  Pertinent labs & imaging results that were available during my care of the patient were reviewed by me and considered in my medical decision making (see chart for details).    Final Clinical Impressions(s) / UC Diagnoses   Final diagnoses:  Left foot pain  Sprain of left great toe, initial encounter     Discharge Instructions     Ice and rest    ED Prescriptions    Medication Sig Dispense Auth. Provider   HYDROcodone-acetaminophen (NORCO) 5-325 MG tablet Take 1 tablet by mouth every 6 (six) hours as needed for moderate pain. 12 tablet Robyn Haber, MD     I have reviewed the PDMP during this encounter.   Robyn Haber, MD 07/22/19 1115    Robyn Haber, MD 07/22/19 1118

## 2019-07-22 NOTE — Discharge Instructions (Addendum)
Ice and rest.

## 2019-07-22 NOTE — ED Triage Notes (Signed)
Patient slipped and fell down the stairs this morning.  Having left foot/big toe pain.

## 2019-07-26 DIAGNOSIS — F411 Generalized anxiety disorder: Secondary | ICD-10-CM | POA: Diagnosis not present

## 2019-08-16 MED FILL — SERTRALINE HCL 50 MG TABLET: 50 | 30 days supply | Qty: 30 | Fill #3

## 2019-09-13 MED FILL — SERTRALINE HCL 50 MG TABLET: 50 | 30 days supply | Qty: 30 | Fill #4

## 2019-09-24 DIAGNOSIS — Z7189 Other specified counseling: Secondary | ICD-10-CM | POA: Diagnosis not present

## 2019-09-24 DIAGNOSIS — Z0489 Encounter for examination and observation for other specified reasons: Secondary | ICD-10-CM | POA: Diagnosis not present

## 2019-10-09 DIAGNOSIS — Z3040 Encounter for surveillance of contraceptives, unspecified: Secondary | ICD-10-CM | POA: Diagnosis not present

## 2019-10-17 MED FILL — SERTRALINE HCL 50 MG TABS: 50 | 30 days supply | Qty: 30 | Fill #5

## 2019-11-13 MED FILL — SERTRALINE HCL 50 MG TABS: 50 | 30 days supply | Qty: 30 | Fill #0

## 2019-11-22 DIAGNOSIS — Z113 Encounter for screening for infections with a predominantly sexual mode of transmission: Secondary | ICD-10-CM | POA: Diagnosis not present

## 2019-11-22 DIAGNOSIS — Z3202 Encounter for pregnancy test, result negative: Secondary | ICD-10-CM | POA: Diagnosis not present

## 2019-11-22 DIAGNOSIS — Z30430 Encounter for insertion of intrauterine contraceptive device: Secondary | ICD-10-CM | POA: Diagnosis not present

## 2019-12-12 MED FILL — SERTRALINE HCL 50 MG TABS: 50 | 30 days supply | Qty: 30 | Fill #0

## 2020-04-08 ENCOUNTER — Telehealth: Payer: Self-pay | Admitting: Allergy & Immunology

## 2020-04-08 NOTE — Telephone Encounter (Signed)
The patient will have to see one of the providers that does the vaccine component testing, Dr. Selena Batten, Dr. Dellis Anes, or Dr. Delorse Lek. Thanks.

## 2020-04-08 NOTE — Telephone Encounter (Signed)
Please advise. Thank you

## 2020-04-08 NOTE — Telephone Encounter (Signed)
Please have patient schedule for new patient visit for evaluation with possible covid-19 component testing.   Thank you.

## 2020-04-08 NOTE — Telephone Encounter (Signed)
Patient states she was referred to the [facility] office to due a reaction to the flu shot. Patient had first reaction to flu vaccine in 2013. Delayed anaphylaxis reaction 5-10 days, body hives, wheezing, coughing, and heart abnormality. Patient had the same reaction to the flu vaccine in 2014 and 2015. Patient would like to be tested for polysorbate 80 and would be interested in the full component testing.  Please advise if full testing can be completed or just test for polysorbate.

## 2020-04-09 NOTE — Telephone Encounter (Signed)
Pt is scheduled for [facility] nov 16th 1030 am

## 2020-04-29 ENCOUNTER — Other Ambulatory Visit: Payer: Self-pay

## 2020-04-29 ENCOUNTER — Encounter: Payer: Self-pay | Admitting: Allergy

## 2020-04-29 ENCOUNTER — Ambulatory Visit (INDEPENDENT_AMBULATORY_CARE_PROVIDER_SITE_OTHER): Payer: BC Managed Care – PPO | Admitting: Allergy

## 2020-04-29 VITALS — BP 128/78 | HR 88 | Resp 17 | Ht 65.0 in | Wt 129.5 lb

## 2020-04-29 DIAGNOSIS — Z872 Personal history of diseases of the skin and subcutaneous tissue: Secondary | ICD-10-CM | POA: Diagnosis not present

## 2020-04-29 DIAGNOSIS — Z7185 Encounter for immunization safety counseling: Secondary | ICD-10-CM | POA: Diagnosis not present

## 2020-04-29 NOTE — Patient Instructions (Signed)
Recommend getting the COVID-19 vaccine - in your case Pfizer.  Take zyrtec 10mg  and famotidine 20mg  1-2 hours before your vaccine. Will have you wait in the office for 30 minutes afterwards.  You may continue to take zyrtec 10mg  once a day and famotidine 20mg  once a day for 1-2 weeks after your vaccine.  If you have any flares of your rash then recommend that you increase zyrtec to 10mg  twice a day and famotidine 20mg  to twice a day. Let know if this happens.   Follow up for Pfizer vaccine.

## 2020-04-29 NOTE — Assessment & Plan Note (Signed)
Patient has history of breaking out in hives with chest pain/pressure, irregular heartbeat usually 5-7 days after getting the flu vaccine. Symptoms lasted from fall through summer. This occurred back to back for 3 years. No reoccurred since stopping getting flu shots in 2015. No prior issues with other vaccinations. Concerned about receiving the COVID-19 vaccine. No prior COVID-19 infection.   Discussed with patient at length that her history does not support an IgE mediated reaction to the flu vaccine. There is a possibility that she had a strong immune response to the vaccine versus she was going to break out in hives nevertheless. Either way there is no testing for this type of reactions.  Patient does not meet criteria for COVID-19 component skin testing as that test is used to identify IgE-mediated reactions.   Given her clinical history, recommend getting the COVID-19 vaccine preferably the mrna vaccines. The J&J vaccine contains polysorbate-80 which is found in flu vaccines as well and if there's a chance that polysorbate-80 triggered the hives then choosing a vaccine that does not contain it would be the best option.  Regarding the Pfizer or Moderna, the Moderna has of the MRNA versus Pfizer has of the MRNA. The higher dose of MRNA may trigger a stronger immune response in this patient and therefore after discussing the pros and cons of each vaccine, we agreed that Pfizer COVID-19 vaccine would be the best option for her.   She is still concerned about breaking out in hives and therefore will premedicate with antihistamines.   Take zyrtec 10mg  and famotidine 20mg  1-2 hours before the vaccine appointment. Wait 30 minutes after the injection.   May continue to take zyrtec 10mg  once a day and famotidine 20mg  once a day for 1-2 weeks afterwards.   If patient has an outbreak of hives then increase zyrtec to 10mg  twice a day and famotidine 20mg  to twice a day and let know.

## 2020-04-29 NOTE — Progress Notes (Signed)
New Patient Note  RE: Taylor Bell MRN: [id number] DOB: 07/13/1980 Date of Office Visit: 04/29/2020  Referring provider: No ref. provider found Primary care provider: Loleta Dicker, FNP  Chief Complaint: Advice Only  History of Present Illness: I had the pleasure of seeing Taylor Bell for initial evaluation at the [facility] on 04/29/2020. She is a 39 y.o. female, who is self-referred here for the evaluation of vaccine reaction.  Patient had issues with flu vaccines in the past. Usually 5-7 days afterwards the injection she would break out in full body hives, develop chest pain/pressure, and irregular heartbeat. These symptoms would lasts from fall through the summer months. This happened on 3 separate occasions. Patient had no flu shot since 2015 and had no reoccurrence of the above symptoms. At that time patient had evaluation for asthma, checked EKG, and skin testing for environmental allergies which were unremarkable per patient report.  Patient was treated with prednisone with minimal benefit. Usually had to take antihistamines and Pepcid to calm the symptoms.  Patient would like to get the COVID-19 vaccine but is hesitant in getting it due to the above history.   Any known reactions to polyethylene glycol or polysorbate?  No.   Any history of anaphylaxis to vaccinations? No other issues with other vaccines.  No issues with tetanus booster or other childhood vaccinations.  Any history of reactions to injectable medications? No other injectable medications.   Any history of anaphylaxis to colonoscopy preps (i.e.Miralax)? No issues with stool softeners during pregnancy.   Any history of dermal filler treatments in the last year? No.  Assessment and Plan: Taylor Bell is a 39 y.o. female with: History of urticaria Patient has history of breaking out in hives with chest pain/pressure, irregular heartbeat usually 5-7 days after getting the flu vaccine. Symptoms  lasted from fall through summer. This occurred back to back for 3 years. No reoccurred since stopping getting flu shots in 2015. No prior issues with other vaccinations. Concerned about receiving the COVID-19 vaccine. No prior COVID-19 infection.   Discussed with patient at length that her history does not support an IgE mediated reaction to the flu vaccine. There is a possibility that she had a strong immune response to the vaccine versus she was going to break out in hives nevertheless. Either way there is no testing for this type of reactions.  Patient does not meet criteria for COVID-19 component skin testing as that test is used to identify IgE-mediated reactions.   Given her clinical history, recommend getting the COVID-19 vaccine preferably the mrna vaccines. The J&J vaccine contains polysorbate-80 which is found in flu vaccines as well and if there's a chance that polysorbate-80 triggered the hives then choosing a vaccine that does not contain it would be the best option.  Regarding the Pfizer or Moderna, the Moderna has of the MRNA versus Pfizer has of the MRNA. The higher dose of MRNA may trigger a stronger immune response in this patient and therefore after discussing the pros and cons of each vaccine, we agreed that Pfizer COVID-19 vaccine would be the best option for her.   She is still concerned about breaking out in hives and therefore will premedicate with antihistamines.   Take zyrtec 10mg  and famotidine 20mg  1-2 hours before the vaccine appointment. Wait 30 minutes after the injection.   May continue to take zyrtec 10mg  once a day and famotidine 20mg  once a day for 1-2 weeks afterwards.  If patient has an outbreak of hives then increase zyrtec to 10mg  twice a day and famotidine 20mg  to twice a day and let us know.   Return in about 2 days (around 05/01/2020).  Other allergy screening: Asthma: no Rhino conjunctivitis: yes  Mild rhinitis symptoms and takes zyrtec  with good benefit.   Food allergy: no Hymenoptera allergy: no Eczema:no History of recurrent infections suggestive of immunodeficency: no  Diagnostics: None.  Past Medical History: Patient Active Problem List   Diagnosis Date Noted  . History of urticaria 04/29/2020  . Vaccine counseling 04/29/2020  . Pregnancy 09/07/2017  . Normal pregnancy 08/21/2013  . DERMATITIS 04/01/2010  . CONTACT DERMATITIS, EYELID 04/18/2009   Past Medical History:  Diagnosis Date  . AMA (advanced maternal age) multigravida 35+   . Anxiety    lexapro post partum anxiety  . Urticaria    Past Surgical History: Past Surgical History:  Procedure Laterality Date  . DILATION AND CURETTAGE OF UTERUS    . GUM SURGERY  2009  . TYMPANOSTOMY TUBE PLACEMENT    . WISDOM TOOTH EXTRACTION  2000   Medication List:  Current Outpatient Medications  Medication Sig Dispense Refill  . buPROPion (WELLBUTRIN XL) 150 MG 24 hr tablet Take 150 mg by mouth every morning.    . cetirizine (ZYRTEC ALLERGY) 10 MG tablet Zyrtec 10 mg tablet  Take 1 tablet every day by oral route.    Marland Kitchen. ibuprofen (ADVIL,MOTRIN) 600 MG tablet Take 1 tablet (600 mg total) by mouth every 6 (six) hours. 30 tablet 0  . nicotine (NICODERM CQ - DOSED IN MG/24 HOURS) 14 mg/24hr patch 14 mg daily.    . paragard intrauterine copper IUD IUD ParaGard T 380A 380 square mm intrauterine device  Take 1 device by intrauterine route.    . Prenatal Vit-Fe Fumarate-FA (PRENATAL MULTIVITAMIN) TABS tablet Take 1 tablet by mouth at bedtime.    . sertraline (ZOLOFT) 50 MG tablet sertraline 50 mg tablet  TAKE 1 TABLET BY MOUTH ONCE A DAY    . varenicline (CHANTIX) 1 MG tablet Chantix Continuing Month Box 1 mg tablet  Take 1 tablet twice a day by oral route.     No current facility-administered medications for this visit.   Allergies: Allergies  Allergen Reactions  . Flu Virus Vaccine Other (See Comments) and Hives  . Other Anaphylaxis, Other (See Comments)  and Hives   Social History: Social History   Socioeconomic History  . Marital status: Legally Separated    Spouse name: Not on file  . Number of children: Not on file  . Years of education: Not on file  . Highest education level: Not on file  Occupational History  . Not on file  Tobacco Use  . Smoking status: Current Some Day Smoker    Last attempt to quit: 09/05/2016    Years since quitting: 3.6  . Smokeless tobacco: Never Used  Vaping Use  . Vaping Use: Never used  Substance and Sexual Activity  . Alcohol use: No  . Drug use: No  . Sexual activity: Yes  Other Topics Concern  . Not on file  Social History Narrative  . Not on file   Social Determinants of Health   Financial Resource Strain:   . Difficulty of Paying Living Expenses: Not on file  Food Insecurity:   . Worried About Programme researcher, broadcasting/film/videounning Out of Food in the Last Year: Not on file  . Ran Out of Food in the Last Year: Not on file  Transportation Needs:   . Freight forwarder (Medical): Not on file  . Lack of Transportation (Non-Medical): Not on file  Physical Activity:   . Days of Exercise per Week: Not on file  . Minutes of Exercise per Session: Not on file  Stress:   . Feeling of Stress : Not on file  Social Connections:   . Frequency of Communication with Friends and Family: Not on file  . Frequency of Social Gatherings with Friends and Family: Not on file  . Attends Religious Services: Not on file  . Active Member of Clubs or Organizations: Not on file  . Attends Banker Meetings: Not on file  . Marital Status: Not on file   Lives in a [address] year old home. Smoking: 1/2 pack per day x 15 years Occupation: HR Programmer, applications History: Water Damage/mildew in the house: no Engineer, civil (consulting) in the family room: yes Carpet in the bedroom: yes Heating: heat pump Cooling: central Pet: yes 2 dogs x 10 yrs  and 1 cat x 14 yrs  Family History: Family History  Problem Relation Age of Onset  . Breast  cancer Mother   . Diabetes Father   . Hypertension Maternal Aunt   . Heart attack Paternal Grandfather    Problem                               Relation Asthma                                   No  Eczema                                No  Food allergy                          No  Allergic rhino conjunctivitis     No  Drug allergies    No  Review of Systems  Constitutional: Negative for appetite change, chills, fever and unexpected weight change.  HENT: Negative for congestion and rhinorrhea.   Eyes: Negative for itching.  Respiratory: Negative for cough, chest tightness, shortness of breath and wheezing.   Cardiovascular: Negative for chest pain.  Gastrointestinal: Negative for abdominal pain.  Genitourinary: Negative for difficulty urinating.  Skin: Negative for rash.  Allergic/Immunologic: Negative for food allergies.  Neurological: Negative for headaches.   Objective: BP 128/78   Pulse 88   Resp 17   Ht 5\' 5"  (1.651 m)   Wt 129 lb 8 oz (58.7 kg)   SpO2 96%   BMI 21.55 kg/m  Body mass index is 21.55 kg/m. Physical Exam Vitals and nursing note reviewed.  Constitutional:      Appearance: Normal appearance. She is well-developed and normal weight.  HENT:     Head: Normocephalic and atraumatic.     Right Ear: Tympanic membrane and external ear normal.     Left Ear: Tympanic membrane and external ear normal.     Nose: Nose normal.     Mouth/Throat:     Mouth: Mucous membranes are moist.     Pharynx: Oropharynx is clear.  Eyes:     Conjunctiva/sclera: Conjunctivae normal.  Cardiovascular:     Rate and Rhythm: Normal rate and regular rhythm.  Heart sounds: Normal heart sounds. No murmur heard.  No friction rub. No gallop.   Pulmonary:     Effort: Pulmonary effort is normal.     Breath sounds: Normal breath sounds. No wheezing, rhonchi or rales.  Musculoskeletal:     Cervical back: Neck supple.  Skin:    General: Skin is warm.     Findings: No rash.    Neurological:     Mental Status: She is alert and oriented to person, place, and time.  Psychiatric:        Behavior: Behavior normal.    The plan was reviewed with the patient/family, and all questions/concerned were addressed.  It was my pleasure to see Taylor Bell today and participate in her care. Please feel free to contact me with any questions or concerns.  Sincerely,  Wyline Mood, DO Allergy & Immunology  [facility] office: [phone] [facility] office: [phone]

## 2020-05-07 ENCOUNTER — Ambulatory Visit (INDEPENDENT_AMBULATORY_CARE_PROVIDER_SITE_OTHER): Payer: BC Managed Care – PPO

## 2020-05-07 ENCOUNTER — Other Ambulatory Visit: Payer: Self-pay

## 2020-05-07 DIAGNOSIS — Z23 Encounter for immunization: Secondary | ICD-10-CM

## 2020-05-13 NOTE — Progress Notes (Signed)
   Covid-19 Vaccination Clinic  Name:  Taylor Bell    MRN: [id number] DOB: [birth year]  05/13/2020  [name] was observed post Covid-19 immunization for 15 minutes without incident. She was provided with Vaccine Information Sheet and instruction to access the V-Safe system.   [name] was instructed to call 911 with any severe reactions post vaccine: Marland Kitchen Difficulty breathing  . Swelling of face and throat  . A fast heartbeat  . A bad rash all over body  . Dizziness and weakness   Immunizations Administered    Name Date Dose VIS Date Route   Pfizer COVID-19 Vaccine 05/07/2020 11:15 AM 0.3 mL 04/02/2020 Intramuscular   Manufacturer: [employer], [employer]   Lot: [id number]   NDC: [address]-5

## 2020-05-27 NOTE — Telephone Encounter (Signed)
Called patient to schedule for send dose of Pfizer. She said since she didn't have a reaction to her 1st dose, she is going to go to her local pharmacy and get her second dose.

## 2020-05-27 NOTE — Telephone Encounter (Signed)
See below

## 2020-05-29 ENCOUNTER — Telehealth: Payer: Self-pay

## 2020-05-29 NOTE — Telephone Encounter (Signed)
Per Geralyn Flash called patient to schedule 2nd dose of the pfizer vaccine. Patient received her first dose 05/07/2020. She said since she did not have a reaction to the first one, she is just going to go to her local pharmacy this weekend and get her second dose.

## 2020-07-24 ENCOUNTER — Other Ambulatory Visit: Payer: Self-pay | Admitting: Obstetrics & Gynecology

## 2020-07-24 DIAGNOSIS — Z1231 Encounter for screening mammogram for malignant neoplasm of breast: Secondary | ICD-10-CM

## 2020-09-15 ENCOUNTER — Other Ambulatory Visit: Payer: Self-pay

## 2020-09-15 ENCOUNTER — Ambulatory Visit
Admission: RE | Admit: 2020-09-15 | Discharge: 2020-09-15 | Disposition: A | Discharge status: Home / Self Care | Payer: BC Managed Care – PPO | Source: Ambulatory Visit | Attending: Obstetrics & Gynecology | Admitting: Obstetrics & Gynecology

## 2020-09-15 DIAGNOSIS — Z1231 Encounter for screening mammogram for malignant neoplasm of breast: Secondary | ICD-10-CM

## 2021-08-14 ENCOUNTER — Other Ambulatory Visit: Payer: Self-pay | Admitting: Obstetrics & Gynecology

## 2021-08-14 DIAGNOSIS — Z1231 Encounter for screening mammogram for malignant neoplasm of breast: Secondary | ICD-10-CM

## 2021-09-17 ENCOUNTER — Ambulatory Visit
Admission: RE | Admit: 2021-09-17 | Discharge: 2021-09-17 | Disposition: A | Discharge status: Home / Self Care | Payer: BC Managed Care – PPO | Source: Ambulatory Visit | Attending: Obstetrics & Gynecology | Admitting: Obstetrics & Gynecology

## 2021-09-17 DIAGNOSIS — Z1231 Encounter for screening mammogram for malignant neoplasm of breast: Secondary | ICD-10-CM

## 2021-10-22 IMAGING — MG MM DIGITAL SCREENING BILAT W/ TOMO AND CAD
8 series · 9 of 24 positions shown · non-contrast
Comparison: Previous exam(s).

CLINICAL DATA: Screening.

EXAM:
DIGITAL SCREENING BILATERAL MAMMOGRAM WITH TOMOSYNTHESIS AND CAD
TECHNIQUE: Bilateral screening digital craniocaudal and mediolateral oblique
mammograms were obtained. Bilateral screening digital breast
tomosynthesis was performed. The images were evaluated with
computer-aided detection.

[L CC synth-2D]
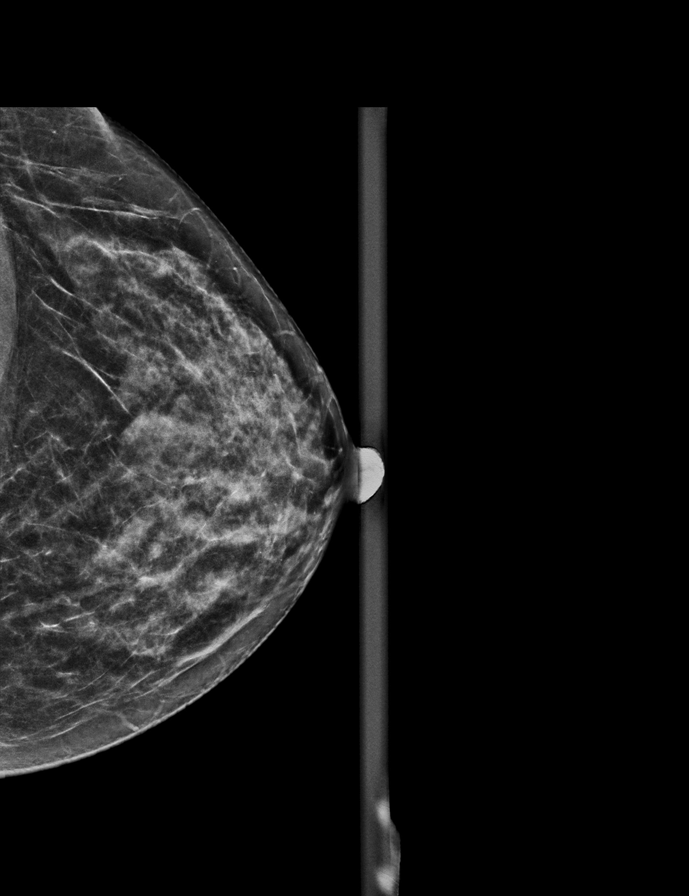

[L MLO synth-2D]
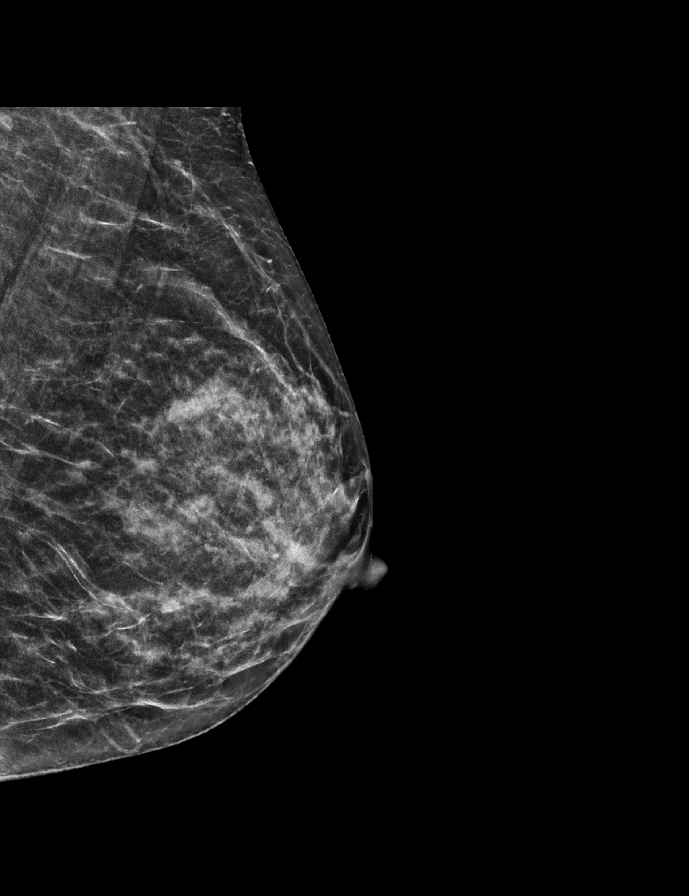

[R CC synth-2D]
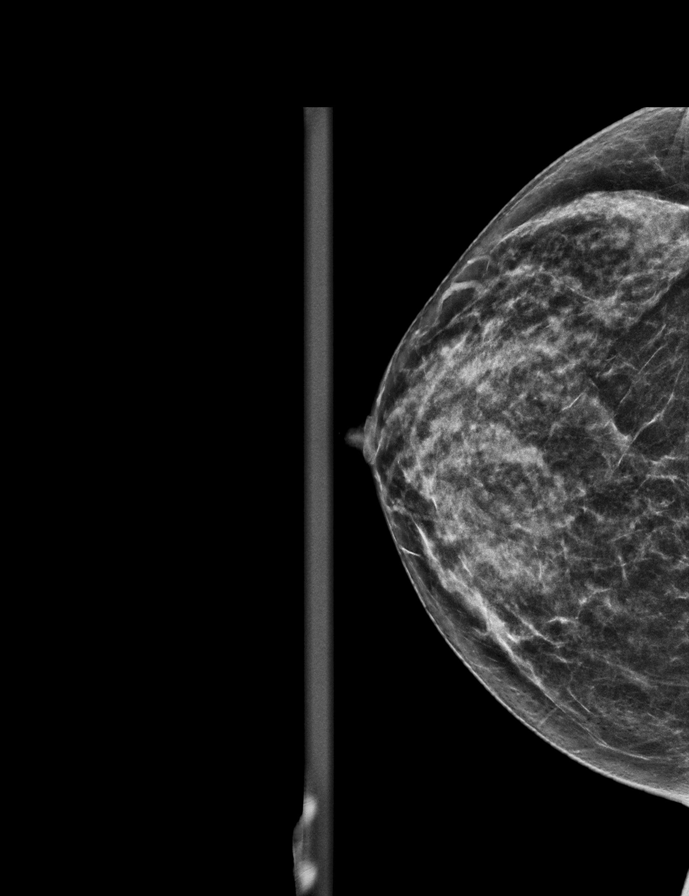

[R MLO synth-2D]
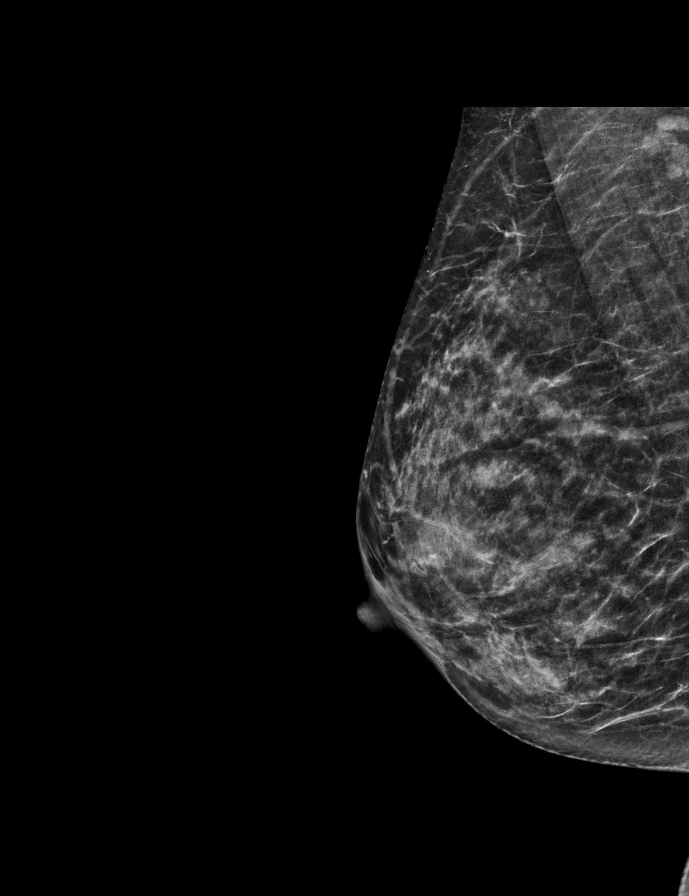

[R CC tomo · 2 of 53 frames shown]
[frame 18/53]
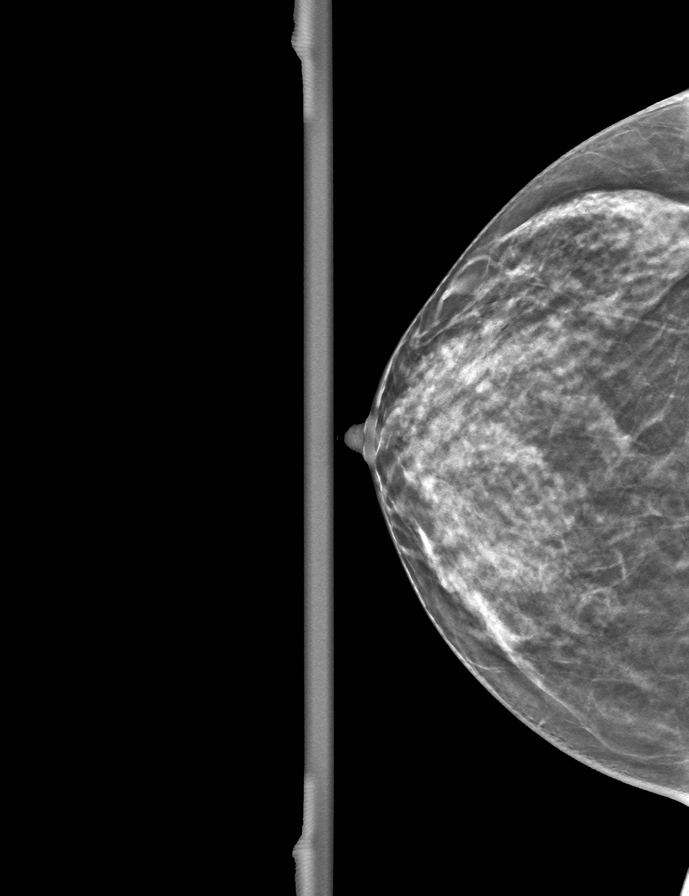
[frame 27/53]
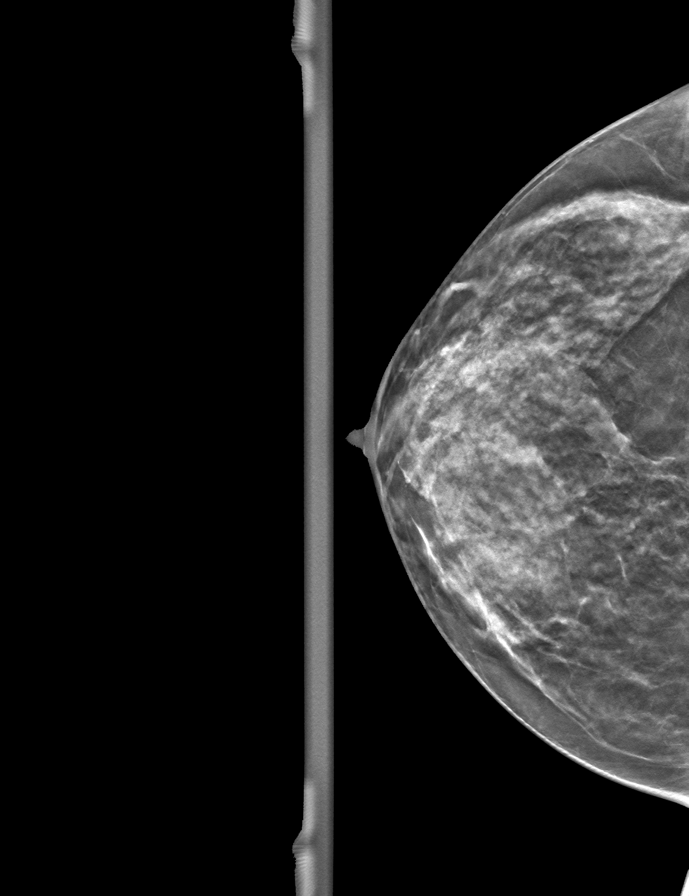

[L CC tomo · tomo slice 29/56.0]
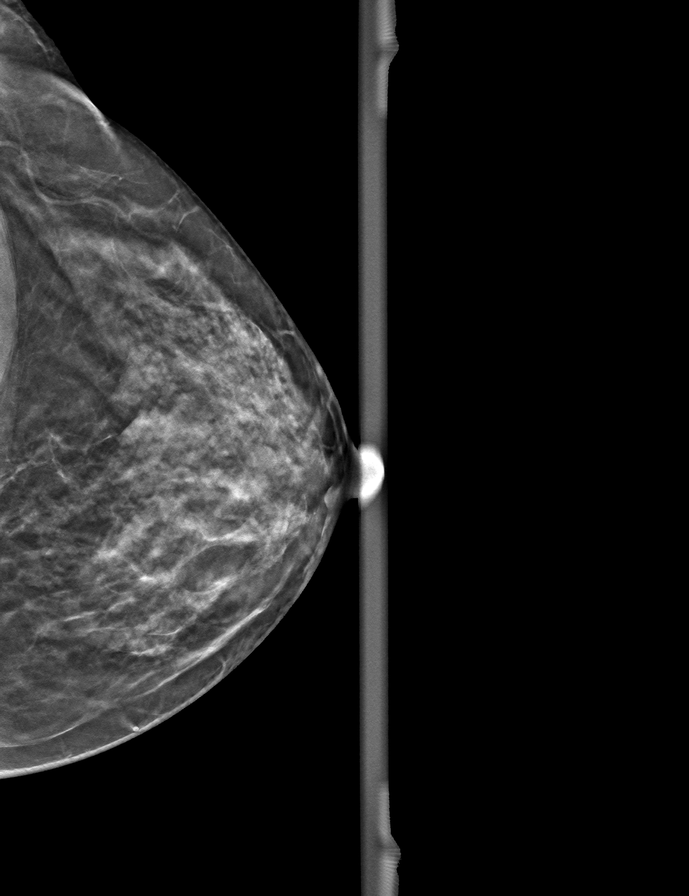

[L MLO tomo · tomo slice 25/49.0]
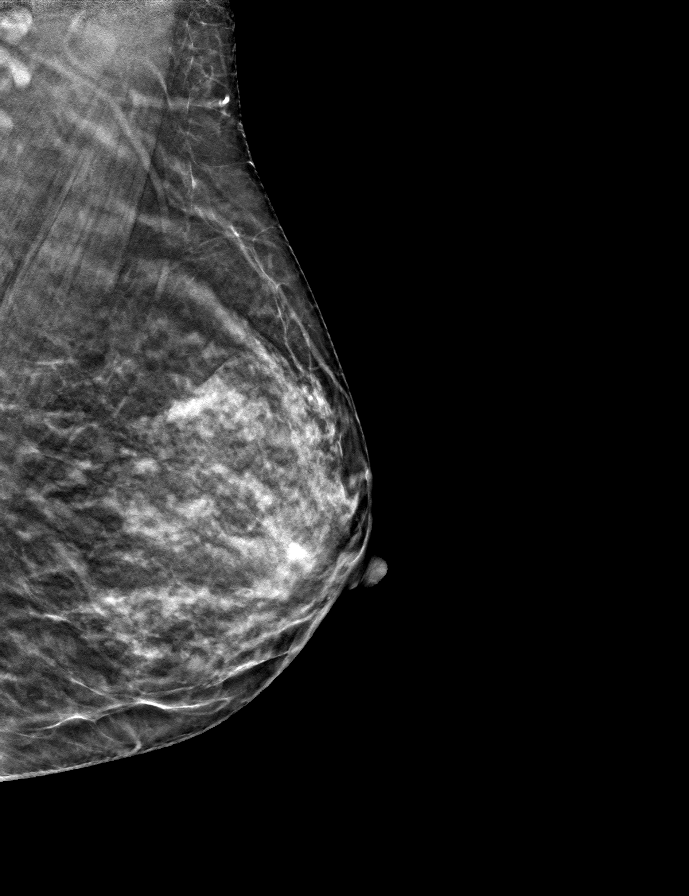

[R MLO tomo · tomo slice 25/49.0]
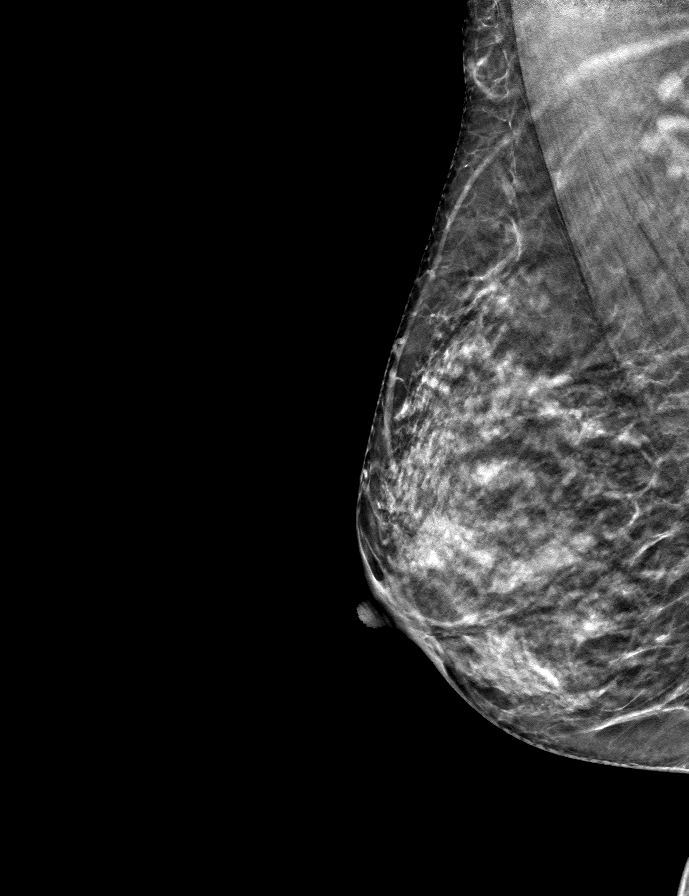

[9 of 24 positions shown; findings below may reference images not displayed]

ACR Breast Density Category c: The breast tissue is heterogeneously
dense, which may obscure small masses.
FINDINGS: There are no findings suspicious for malignancy. The images were
evaluated with computer-aided detection.
IMPRESSION: No mammographic evidence of malignancy. A result letter of this
screening mammogram will be mailed directly to the patient.

RECOMMENDATION:
Screening mammogram in one year. (Code:T4-5-GWO)

BI-RADS CATEGORY  1: Negative.

## 2022-09-23 ENCOUNTER — Other Ambulatory Visit: Payer: Self-pay | Admitting: Obstetrics & Gynecology

## 2022-09-23 DIAGNOSIS — Z1231 Encounter for screening mammogram for malignant neoplasm of breast: Secondary | ICD-10-CM

## 2022-11-02 ENCOUNTER — Ambulatory Visit
Admission: RE | Admit: 2022-11-02 | Discharge: 2022-11-02 | Disposition: A | Discharge status: Home / Self Care | Payer: BC Managed Care – PPO | Source: Ambulatory Visit | Attending: Obstetrics & Gynecology | Admitting: Obstetrics & Gynecology

## 2022-11-02 DIAGNOSIS — Z1231 Encounter for screening mammogram for malignant neoplasm of breast: Secondary | ICD-10-CM

## 2023-09-29 ENCOUNTER — Other Ambulatory Visit: Payer: Self-pay | Admitting: Obstetrics & Gynecology

## 2023-09-29 DIAGNOSIS — Z1231 Encounter for screening mammogram for malignant neoplasm of breast: Secondary | ICD-10-CM

## 2023-11-03 ENCOUNTER — Ambulatory Visit
Admission: RE | Admit: 2023-11-03 | Discharge: 2023-11-03 | Disposition: A | Discharge status: Home / Self Care | Source: Ambulatory Visit | Attending: Obstetrics & Gynecology | Admitting: Obstetrics & Gynecology

## 2023-11-03 DIAGNOSIS — Z1231 Encounter for screening mammogram for malignant neoplasm of breast: Secondary | ICD-10-CM

## 2024-04-17 ENCOUNTER — Other Ambulatory Visit: Payer: Self-pay | Admitting: Obstetrics & Gynecology

## 2024-04-17 DIAGNOSIS — Z9189 Other specified personal risk factors, not elsewhere classified: Secondary | ICD-10-CM

## 2024-05-25 ENCOUNTER — Inpatient Hospital Stay
Admission: RE | Admit: 2024-05-25 | Discharge: 2024-05-25 | Attending: Obstetrics & Gynecology | Admitting: Obstetrics & Gynecology

## 2024-05-25 DIAGNOSIS — Z9189 Other specified personal risk factors, not elsewhere classified: Secondary | ICD-10-CM

## 2024-05-25 MED ORDER — GADOPICLENOL 0.5 MMOL/ML IV SOLN
6.0000 mL | Freq: Once | INTRAVENOUS | Status: AC | PRN
  Administered 2024-05-25: 15:00:00 6 mL via INTRAVENOUS
